# Patient Record
Sex: Male | Born: 1955 | Race: White | Hispanic: No | Marital: Married | Smoking: Never smoker
Health system: Southern US, Community
[De-identification: ages and names within clinical notes are randomized; demographics above are authoritative.]

## PROBLEM LIST (undated history)

## (undated) DIAGNOSIS — I10 Essential (primary) hypertension: Secondary | ICD-10-CM

## (undated) DIAGNOSIS — F419 Anxiety disorder, unspecified: Secondary | ICD-10-CM

## (undated) DIAGNOSIS — E7800 Pure hypercholesterolemia, unspecified: Secondary | ICD-10-CM

## (undated) HISTORY — PX: CORONARY ARTERY BYPASS GRAFT: SHX141

## (undated) HISTORY — PX: CAROTID STENT INSERTION: SHX5766

---

## 2006-03-17 DIAGNOSIS — I2510 Atherosclerotic heart disease of native coronary artery without angina pectoris: Secondary | ICD-10-CM | POA: Insufficient documentation

## 2006-03-17 DIAGNOSIS — E785 Hyperlipidemia, unspecified: Secondary | ICD-10-CM | POA: Insufficient documentation

## 2006-03-17 DIAGNOSIS — I10 Essential (primary) hypertension: Secondary | ICD-10-CM | POA: Insufficient documentation

## 2012-06-05 LAB — HEPATIC FUNCTION PANEL
ALK PHOS: 83 U/L (ref 25–125)
ALT: 42 U/L — AB (ref 10–40)
AST: 29 U/L (ref 14–40)
BILIRUBIN, TOTAL: 0.3 mg/dL

## 2012-06-05 LAB — CBC AND DIFFERENTIAL
HEMATOCRIT: 40 % — AB (ref 41–53)
Hemoglobin: 14 g/dL (ref 13.5–17.5)
NEUTROS ABS: 3 /uL
PLATELETS: 258 10*3/uL (ref 150–399)
WBC: 5.2 10^3/mL

## 2012-06-05 LAB — BASIC METABOLIC PANEL
BUN: 11 mg/dL (ref 4–21)
Creatinine: 1 mg/dL (ref 0.6–1.3)
GLUCOSE: 101 mg/dL
Potassium: 5 mmol/L (ref 3.4–5.3)
SODIUM: 142 mmol/L (ref 137–147)

## 2012-06-05 LAB — PSA: PSA: 1.4

## 2012-06-05 LAB — HEMOGLOBIN A1C: Hemoglobin A1C: 5.9

## 2013-06-05 ENCOUNTER — Emergency Department: Payer: Self-pay | Admitting: Emergency Medicine

## 2013-06-05 LAB — PROTIME-INR
INR: 1
Prothrombin Time: 12.6 secs (ref 11.5–14.7)

## 2013-06-05 LAB — CBC
HCT: 41.2 % (ref 40.0–52.0)
HGB: 13.7 g/dL (ref 13.0–18.0)
MCH: 30.9 pg (ref 26.0–34.0)
MCHC: 33.2 g/dL (ref 32.0–36.0)
MCV: 93 fL (ref 80–100)
Platelet: 270 10*3/uL (ref 150–440)
RBC: 4.43 10*6/uL (ref 4.40–5.90)
RDW: 13.3 % (ref 11.5–14.5)
WBC: 8.8 10*3/uL (ref 3.8–10.6)

## 2013-06-05 LAB — BASIC METABOLIC PANEL
ANION GAP: 9 (ref 7–16)
BUN: 22 mg/dL — ABNORMAL HIGH (ref 7–18)
CREATININE: 1.05 mg/dL (ref 0.60–1.30)
Calcium, Total: 9 mg/dL (ref 8.5–10.1)
Chloride: 103 mmol/L (ref 98–107)
Co2: 27 mmol/L (ref 21–32)
EGFR (Non-African Amer.): >60
Glucose: 145 mg/dL — ABNORMAL HIGH (ref 65–99)
Osmolality: 283 (ref 275–301)
POTASSIUM: 4.2 mmol/L (ref 3.5–5.1)
Sodium: 139 mmol/L (ref 136–145)

## 2013-06-05 LAB — TROPONIN I: Troponin-I: <0.02 ng/mL

## 2013-06-05 LAB — PRO B NATRIURETIC PEPTIDE: B-Type Natriuretic Peptide: 73 pg/mL (ref 0–125)

## 2013-06-06 DIAGNOSIS — Z955 Presence of coronary angioplasty implant and graft: Secondary | ICD-10-CM | POA: Insufficient documentation

## 2013-06-06 DIAGNOSIS — I2510 Atherosclerotic heart disease of native coronary artery without angina pectoris: Secondary | ICD-10-CM | POA: Insufficient documentation

## 2013-06-12 DIAGNOSIS — Z951 Presence of aortocoronary bypass graft: Secondary | ICD-10-CM | POA: Insufficient documentation

## 2013-06-12 DIAGNOSIS — D62 Acute posthemorrhagic anemia: Secondary | ICD-10-CM | POA: Insufficient documentation

## 2013-06-12 DIAGNOSIS — G8918 Other acute postprocedural pain: Secondary | ICD-10-CM | POA: Insufficient documentation

## 2013-10-14 ENCOUNTER — Other Ambulatory Visit: Payer: Self-pay | Admitting: Emergency Medicine

## 2015-02-26 ENCOUNTER — Encounter: Payer: Self-pay | Admitting: Family Medicine

## 2015-02-27 DIAGNOSIS — I519 Heart disease, unspecified: Secondary | ICD-10-CM | POA: Insufficient documentation

## 2015-02-27 DIAGNOSIS — R945 Abnormal results of liver function studies: Secondary | ICD-10-CM | POA: Insufficient documentation

## 2015-02-27 DIAGNOSIS — F419 Anxiety disorder, unspecified: Secondary | ICD-10-CM | POA: Insufficient documentation

## 2015-02-27 DIAGNOSIS — E669 Obesity, unspecified: Secondary | ICD-10-CM | POA: Insufficient documentation

## 2015-02-27 DIAGNOSIS — F329 Major depressive disorder, single episode, unspecified: Secondary | ICD-10-CM | POA: Insufficient documentation

## 2015-02-27 DIAGNOSIS — F32A Depression, unspecified: Secondary | ICD-10-CM | POA: Insufficient documentation

## 2015-02-27 DIAGNOSIS — R7989 Other specified abnormal findings of blood chemistry: Secondary | ICD-10-CM | POA: Insufficient documentation

## 2015-02-27 DIAGNOSIS — R739 Hyperglycemia, unspecified: Secondary | ICD-10-CM | POA: Insufficient documentation

## 2015-02-27 DIAGNOSIS — Z9861 Coronary angioplasty status: Secondary | ICD-10-CM | POA: Insufficient documentation

## 2015-03-05 ENCOUNTER — Encounter: Payer: Self-pay | Admitting: Family Medicine

## 2015-03-12 ENCOUNTER — Encounter: Payer: Self-pay | Admitting: Family Medicine

## 2015-03-12 ENCOUNTER — Other Ambulatory Visit: Payer: Self-pay | Admitting: Family Medicine

## 2015-03-12 ENCOUNTER — Ambulatory Visit (INDEPENDENT_AMBULATORY_CARE_PROVIDER_SITE_OTHER): Payer: BLUE CROSS/BLUE SHIELD | Admitting: Family Medicine

## 2015-03-12 VITALS — BP 138/82 | HR 74 | Temp 98.0°F | Resp 16 | Ht 69.25 in | Wt 191.0 lb

## 2015-03-12 DIAGNOSIS — Z1211 Encounter for screening for malignant neoplasm of colon: Secondary | ICD-10-CM | POA: Diagnosis not present

## 2015-03-12 DIAGNOSIS — Z125 Encounter for screening for malignant neoplasm of prostate: Secondary | ICD-10-CM | POA: Diagnosis not present

## 2015-03-12 DIAGNOSIS — Z951 Presence of aortocoronary bypass graft: Secondary | ICD-10-CM

## 2015-03-12 DIAGNOSIS — I2510 Atherosclerotic heart disease of native coronary artery without angina pectoris: Secondary | ICD-10-CM | POA: Diagnosis not present

## 2015-03-12 DIAGNOSIS — R739 Hyperglycemia, unspecified: Secondary | ICD-10-CM | POA: Diagnosis not present

## 2015-03-12 DIAGNOSIS — Z0000 Encounter for general adult medical examination without abnormal findings: Secondary | ICD-10-CM | POA: Diagnosis not present

## 2015-03-12 DIAGNOSIS — H9192 Unspecified hearing loss, left ear: Secondary | ICD-10-CM | POA: Diagnosis not present

## 2015-03-12 LAB — POCT URINALYSIS DIPSTICK
Bilirubin, UA: NEGATIVE
Blood, UA: NEGATIVE
Glucose, UA: NEGATIVE
KETONES UA: NEGATIVE
LEUKOCYTES UA: NEGATIVE
Nitrite, UA: NEGATIVE
PH UA: 7
PROTEIN UA: NEGATIVE
SPEC GRAV UA: 1.01
UROBILINOGEN UA: NEGATIVE

## 2015-03-12 LAB — IFOBT (OCCULT BLOOD): IMMUNOLOGICAL FECAL OCCULT BLOOD TEST: POSITIVE

## 2015-03-12 NOTE — Progress Notes (Signed)
Patient ID: Philip Mora, male   DOB: Aug 13, 1955, 60 y.o.   MRN: [id number]  Visit Date: 03/12/2015  Today's Provider: Megan Mans, MD   Chief Complaint  Patient presents with  . Annual Exam   Subjective:  Philip Mora is a 60 y.o. male who presents today for health maintenance and complete physical. He feels fairly well. He reports exercising at least 2 times a week goes to the gym and does a lot of lifting at work. He reports he is sleeping well.  Last Tdap 05/08/12 Never had colonoscopy.  Patient saw [place] last in 2014 and did not realize its been so long. He had a heart attack about 2 years after seen [place] and has been following Dr. Lady Gary for that. He has had physical exams through work because they offered it free and he got cash for it.  Review of Systems  Constitutional: Negative.   HENT: Positive for tinnitus.   Eyes: Negative.   Respiratory: Negative.   Cardiovascular: Negative.   Gastrointestinal: Negative.   Endocrine: Negative.   Genitourinary: Negative.   Musculoskeletal: Negative.   Skin: Negative.   Allergic/Immunologic: Negative.   Neurological: Negative.   Hematological: Negative.   Psychiatric/Behavioral: Negative.     Social History   Social History  . Marital Status: Married    Spouse Name: N/A  . Number of Children: N/A  . Years of Education: N/A   Occupational History  . Not on file.   Social History Main Topics  . Smoking status: Never Smoker   . Smokeless tobacco: Never Used  . Alcohol Use: No  . Drug Use: No  . Sexual Activity: Yes   Other Topics Concern  . Not on file   Social History Narrative    Patient Active Problem List   Diagnosis Date Noted  . Anxiety 02/27/2015  . Clinical depression 02/27/2015  . Abnormal LFTs 02/27/2015  . Heart disease 02/27/2015  . Blood glucose elevated 02/27/2015  . Adiposity 02/27/2015  . Percutaneous transluminal coronary angioplasty status 02/27/2015  . Acute post-operative pain  06/12/2013  . Acute blood loss anemia 06/12/2013  . H/O coronary artery bypass surgery 06/12/2013  . Arteriosclerosis of coronary artery 06/06/2013  . Presence of stent in coronary artery 06/06/2013  . Atherosclerosis of coronary artery 03/17/2006  . BP (high blood pressure) 03/17/2006  . HLD (hyperlipidemia) 03/17/2006    Past Surgical History  Procedure Laterality Date  . Carotid stent insertion    . Coronary artery bypass graft      His family history includes Congestive Heart Failure in his father; Diabetes in his mother; Heart disease in his brother; Hypertension in his mother.     Outpatient Encounter Prescriptions as of 03/12/2015  Medication Sig Note  . atorvastatin (LIPITOR) 40 MG tablet Take 40 mg by mouth at bedtime. 03/12/2015: Received from: External Pharmacy  . clopidogrel (PLAVIX) 75 MG tablet Take 75 mg by mouth daily. 03/12/2015: Received from: External Pharmacy  . metoprolol tartrate (LOPRESSOR) 25 MG tablet TAKE 1 AND 1/2 TABLETS BY MOUTH TWICE A DAY 03/12/2015: Received from: External Pharmacy  . sertraline (ZOLOFT) 50 MG tablet Take by mouth. 02/27/2015: Patient will need to be seen for more Received from: [facility]  . [DISCONTINUED] Olmesartan-Amlodipine-HCTZ (TRIBENZOR) 20-5-12.5 MG TABS Take by mouth. 02/27/2015: Needs appointment --not seen in  more than 1 year. Received from: [facility]   No facility-administered encounter medications on file as of 03/12/2015.    Patient Care Team: Gerlene Burdock  L Wendelyn Breslow., MD as PCP - General (Family Medicine)     Objective:   Vitals:  Filed Vitals:   03/12/15 1043  BP: 138/82  Pulse: 74  Temp: 98 F (36.7 C)  Resp: 16  Height: 5' 9.25" (1.759 m)  Weight: 191 lb (86.637 kg)    Physical Exam  Constitutional: He is oriented to person, place, and time. He appears well-developed and well-nourished.  HENT:  Head: Normocephalic and atraumatic.  Right Ear: External ear normal.  Left  Ear: External ear normal.  Eyes: Conjunctivae are normal. Pupils are equal, round, and reactive to light.  Chronic lazy right eye.  Neck: Normal range of motion. Neck supple.  Cardiovascular: Normal rate, regular rhythm, normal heart sounds and intact distal pulses.   No murmur heard. Pulmonary/Chest: Effort normal and breath sounds normal. No respiratory distress. He has no wheezes.  Abdominal: Soft. He exhibits no distension. There is no tenderness.  Genitourinary: Penis normal. Guaiac positive stool. No penile tenderness.  Musculoskeletal: Normal range of motion. He exhibits no edema or tenderness.  Neurological: He is alert and oriented to person, place, and time.  Skin: Skin is warm and dry. No rash noted. No erythema.  Psychiatric: He has a normal mood and affect. His behavior is normal. Judgment and thought content normal.     Depression Screen PHQ 2/9 Scores 03/12/2015  PHQ - 2 Score 0      Assessment & Plan:   1. Annual physical exam EKG stable. - CBC with Differential/Platelet - Comprehensive metabolic panel - Lipid Panel With LDL/HDL Ratio - POCT Urinalysis Dipstick - TSH - EKG 12-Lead  2. Prostate cancer screening - PSA  3. Colon cancer screening OCLyte is positive for blood and patient is due for colonoscopy also. Refer to GI to address both. - IFOBT POC (occult bld, rslt in office) - Ambulatory referral to Gastroenterology  4. Decreased hearing, left Audiometry is abnormal in the left ear. May need referral in the future but patient wants to wait at this time. ENT/audiology  referral at patient request. 5. Arteriosclerosis of coronary artery Follows Dr. Lady Gary. Patient is working on his habits to make sure to stay as healthy as possible.  6. H/O coronary artery bypass surgery==2015  7. Blood glucose elevated History of this, will check labs, pending results. - HgB A1c I have done the exam and reviewed the above chart and it is accurate to the best of my  knowledge.

## 2015-03-13 LAB — CBC WITH DIFFERENTIAL/PLATELET
Basophils Absolute: 0 10*3/uL (ref 0.0–0.2)
Basos: 0 %
EOS (ABSOLUTE): 0.2 10*3/uL (ref 0.0–0.4)
EOS: 3 %
HEMATOCRIT: 44.5 % (ref 37.5–51.0)
Hemoglobin: 14.9 g/dL (ref 12.6–17.7)
Immature Grans (Abs): 0 10*3/uL (ref 0.0–0.1)
Immature Granulocytes: 0 %
LYMPHS ABS: 1.9 10*3/uL (ref 0.7–3.1)
Lymphs: 29 %
MCH: 31 pg (ref 26.6–33.0)
MCHC: 33.5 g/dL (ref 31.5–35.7)
MCV: 93 fL (ref 79–97)
MONOS ABS: 0.5 10*3/uL (ref 0.1–0.9)
Monocytes: 8 %
Neutrophils Absolute: 3.9 10*3/uL (ref 1.4–7.0)
Neutrophils: 60 %
Platelets: 261 10*3/uL (ref 150–379)
RBC: 4.8 x10E6/uL (ref 4.14–5.80)
RDW: 12.9 % (ref 12.3–15.4)
WBC: 6.5 10*3/uL (ref 3.4–10.8)

## 2015-03-13 LAB — COMPREHENSIVE METABOLIC PANEL
A/G RATIO: 1.8 (ref 1.1–2.5)
ALK PHOS: 89 IU/L (ref 39–117)
ALT: 40 IU/L (ref 0–44)
AST: 27 IU/L (ref 0–40)
Albumin: 4.6 g/dL (ref 3.5–5.5)
BILIRUBIN TOTAL: 0.9 mg/dL (ref 0.0–1.2)
BUN / CREAT RATIO: 14 (ref 9–20)
BUN: 14 mg/dL (ref 6–24)
CHLORIDE: 100 mmol/L (ref 96–106)
CO2: 25 mmol/L (ref 18–29)
Calcium: 10 mg/dL (ref 8.7–10.2)
Creatinine, Ser: 0.99 mg/dL (ref 0.76–1.27)
GFR calc Af Amer: 97 mL/min/{1.73_m2} (ref >59)
GFR calc non Af Amer: 84 mL/min/{1.73_m2} (ref >59)
GLOBULIN, TOTAL: 2.6 g/dL (ref 1.5–4.5)
Glucose: 90 mg/dL (ref 65–99)
Potassium: 5.2 mmol/L (ref 3.5–5.2)
Sodium: 144 mmol/L (ref 134–144)
Total Protein: 7.2 g/dL (ref 6.0–8.5)

## 2015-03-13 LAB — LIPID PANEL WITH LDL/HDL RATIO
Cholesterol, Total: 125 mg/dL (ref 100–199)
HDL: 57 mg/dL (ref >39)
LDL Calculated: 54 mg/dL (ref 0–99)
LDL/HDL RATIO: 0.9 ratio (ref 0.0–3.6)
Triglycerides: 70 mg/dL (ref 0–149)
VLDL Cholesterol Cal: 14 mg/dL (ref 5–40)

## 2015-03-13 LAB — HEMOGLOBIN A1C
ESTIMATED AVERAGE GLUCOSE: 123 mg/dL
HEMOGLOBIN A1C: 5.9 % — AB (ref 4.8–5.6)

## 2015-03-13 LAB — TSH: TSH: 2.56 u[IU]/mL (ref 0.450–4.500)

## 2015-03-13 LAB — PSA: PROSTATE SPECIFIC AG, SERUM: 2.7 ng/mL (ref 0.0–4.0)

## 2015-03-17 ENCOUNTER — Telehealth: Payer: Self-pay

## 2015-03-17 NOTE — Telephone Encounter (Signed)
Pt notified-aa

## 2015-03-17 NOTE — Telephone Encounter (Signed)
Pt returned your call. His number is [phone].  Thanks [employer]

## 2015-03-17 NOTE — Telephone Encounter (Signed)
lmtcb with patient;s wife, the number we have for patient is incorrect.-aa

## 2015-03-17 NOTE — Telephone Encounter (Signed)
-----   Message from Maple Hudson., MD sent at 03/13/2015  2:11 PM EST ----- Labs ok

## 2016-04-21 IMAGING — CR DG CHEST 1V PORT
1 series · 2 of 2 positions shown · non-contrast
Comparison: None.

CLINICAL DATA: Chest pain since yesterday.  Shortness of breath.

EXAM:
PORTABLE CHEST - 1 VIEW

[Series 1: ap · 0.17mm/px · 2 of 2 slices shown]
[im 1/2]
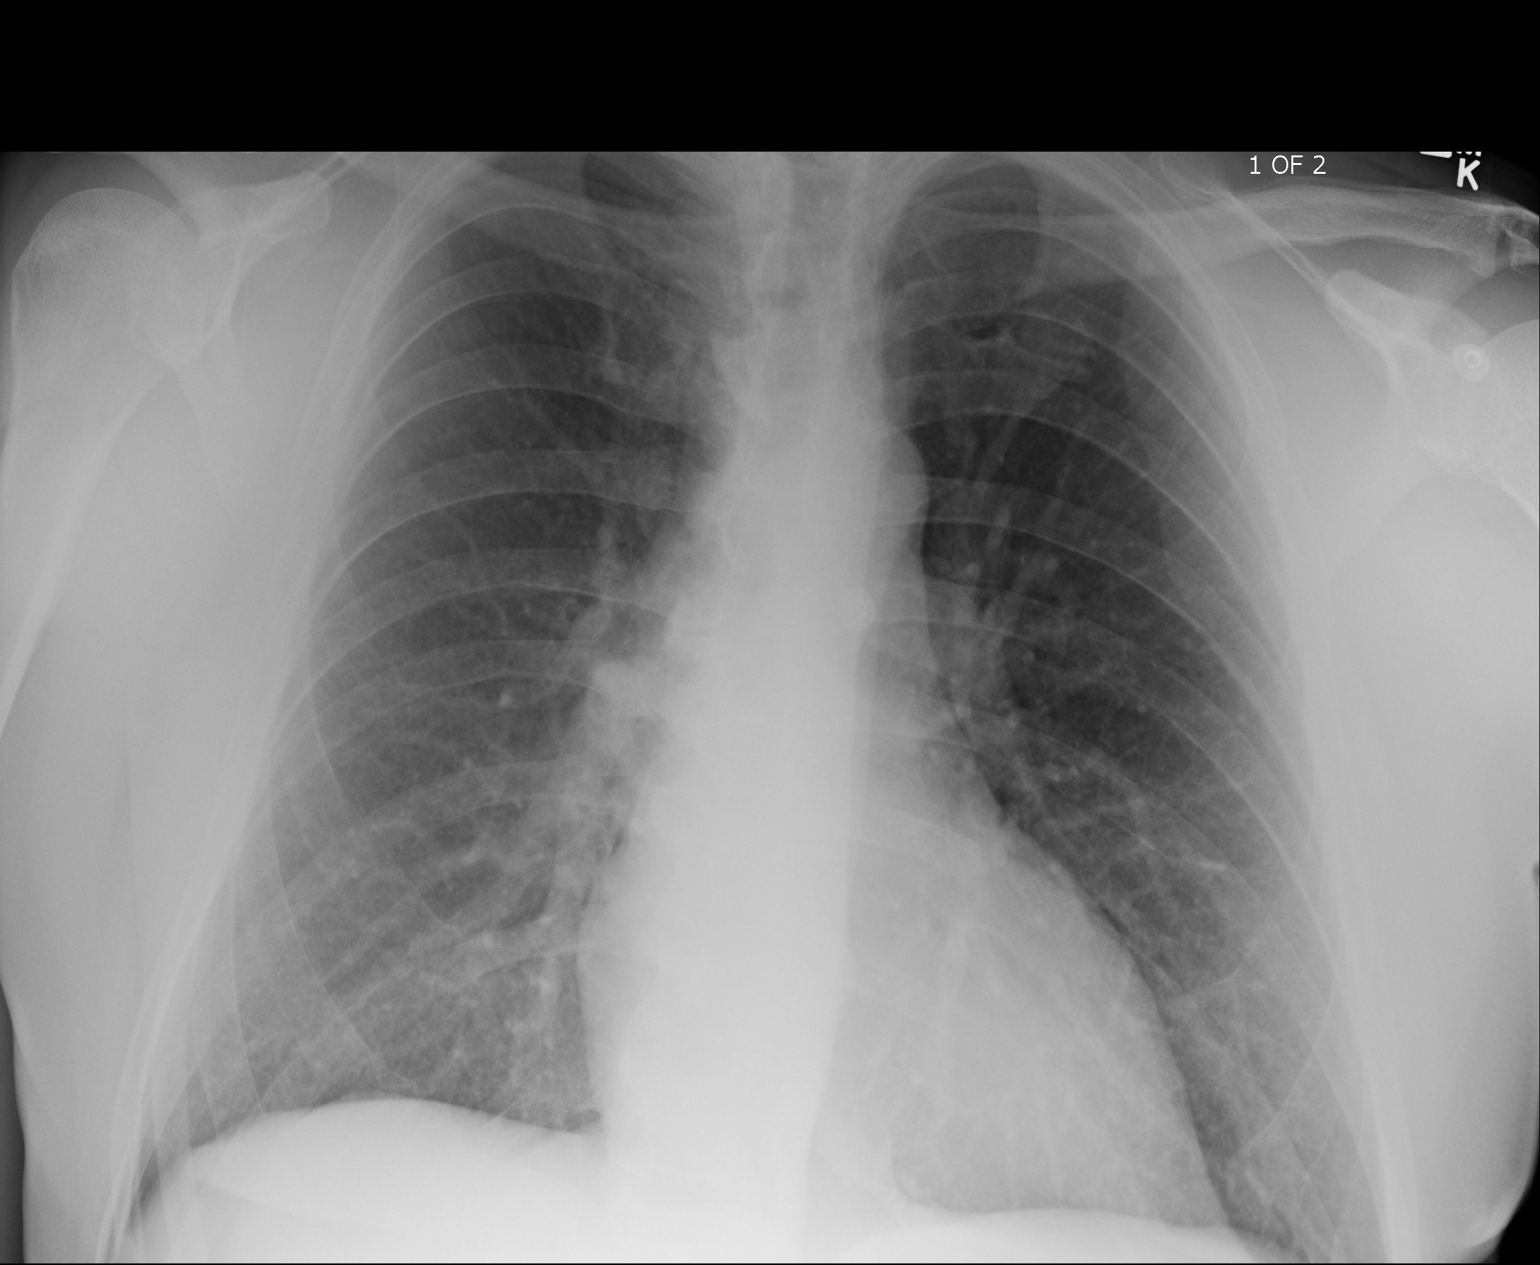
[im 2/2]
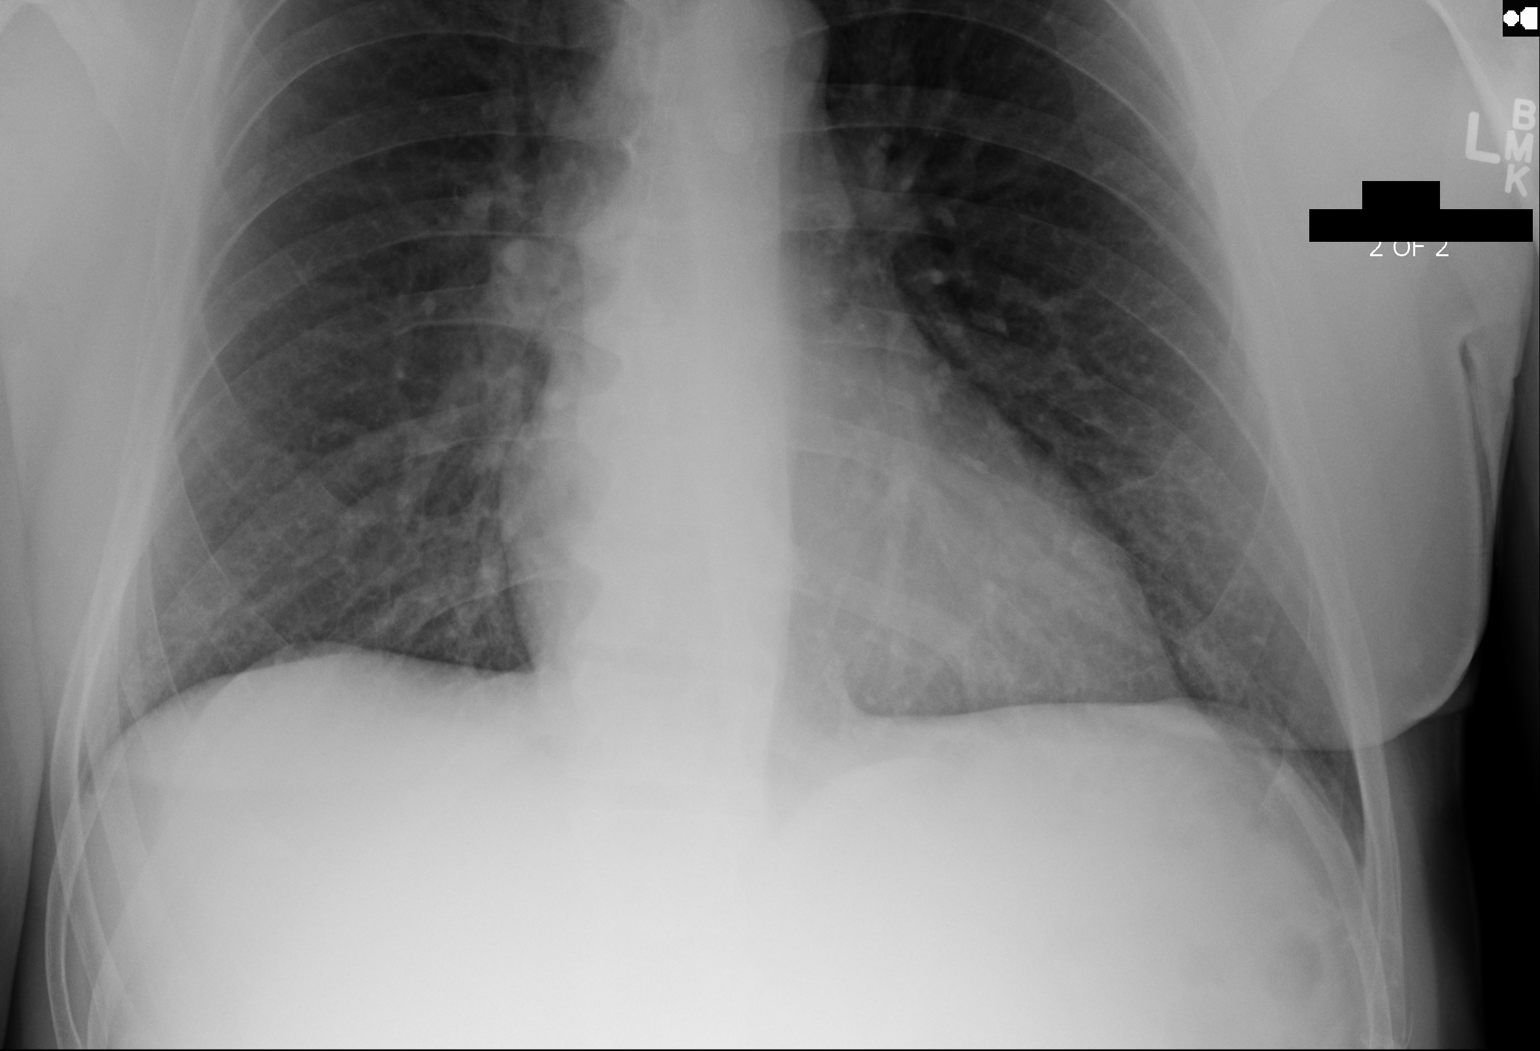

[2 of 2 positions shown; findings below may reference images not displayed]

FINDINGS: Pulmonary hyperinflation. The heart size and mediastinal contours
are within normal limits. Both lungs are clear. The visualized
skeletal structures are unremarkable.
IMPRESSION: No active disease.

## 2017-07-22 ENCOUNTER — Encounter: Payer: Self-pay | Admitting: Gynecology

## 2017-07-22 ENCOUNTER — Ambulatory Visit
Admission: EM | Admit: 2017-07-22 | Discharge: 2017-07-22 | Disposition: A | Discharge status: Home / Self Care | Payer: BLUE CROSS/BLUE SHIELD | Attending: Family Medicine | Admitting: Family Medicine

## 2017-07-22 ENCOUNTER — Other Ambulatory Visit: Payer: Self-pay

## 2017-07-22 DIAGNOSIS — J22 Unspecified acute lower respiratory infection: Secondary | ICD-10-CM

## 2017-07-22 DIAGNOSIS — R5383 Other fatigue: Secondary | ICD-10-CM | POA: Diagnosis not present

## 2017-07-22 HISTORY — DX: Essential (primary) hypertension: I10

## 2017-07-22 HISTORY — DX: Anxiety disorder, unspecified: F41.9

## 2017-07-22 HISTORY — DX: Pure hypercholesterolemia, unspecified: E78.00

## 2017-07-22 MED ORDER — PREDNISONE 20 MG PO TABS: 40.0000 mg | ORAL_TABLET | Freq: Every day | ORAL | 0 refills | Status: AC

## 2017-07-22 MED ORDER — AZITHROMYCIN 250 MG PO TABS: ORAL_TABLET | ORAL | 0 refills | Status: AC

## 2017-07-22 MED ORDER — BENZONATATE 100 MG PO CAPS: 200.0000 mg | ORAL_CAPSULE | Freq: Three times a day (TID) | ORAL | 0 refills | Status: DC | PRN

## 2017-07-22 NOTE — ED Triage Notes (Signed)
Per patient x 3 weeks with cough / sneezing. 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

## 2017-07-22 NOTE — Discharge Instructions (Signed)
Push fluids to ensure adequate hydration and keep secretions thin.  Complete course of antibiotics.  5 days of prednisone. Use of tessalon as needed for cough.  If symptoms worsen or do not improve in the next week to return to be seen or to follow up with your PCP.

## 2017-07-22 NOTE — ED Provider Notes (Signed)
[facility]    CSN: [id number] Arrival date & time: 07/22/17  1527     History   Chief Complaint Chief Complaint  Patient presents with  . Cough    HPI Philip Mora is a 62 y.o. male.   Philip Mora presents with complaints of persistent cough and fatigue as well as nasal congestion. Cough is productive of phlegm. Started three weeks ago with fevers and headache. These have improved but cough and congestion has remained. Much more fatigued this am. No shortness of breath  Or wheezing. No chest pain. No ear pain or sore throat.  No history of asthma, does not smoke. Denies gi/gu complaints. No skin rash. States works in a very cold environment so tends to have some nasal drainage but this is much more severe than usual for him. Hx of htn, anxiety, high cholesterol, cardiac stents.    ROS per HPI.      Past Medical History:  Diagnosis Date  . Anxiety   . Hypercholesteremia   . Hypertension     Patient Active Problem List   Diagnosis Date Noted  . Anxiety 02/27/2015  . Clinical depression 02/27/2015  . Abnormal LFTs 02/27/2015  . Heart disease 02/27/2015  . Blood glucose elevated 02/27/2015  . Adiposity 02/27/2015  . Percutaneous transluminal coronary angioplasty status 02/27/2015  . Acute post-operative pain 06/12/2013  . Acute blood loss anemia 06/12/2013  . H/O coronary artery bypass surgery 06/12/2013  . Arteriosclerosis of coronary artery 06/06/2013  . Presence of stent in coronary artery 06/06/2013  . Atherosclerosis of coronary artery 03/17/2006  . BP (high blood pressure) 03/17/2006  . HLD (hyperlipidemia) 03/17/2006    Past Surgical History:  Procedure Laterality Date  . CAROTID STENT INSERTION    . CORONARY ARTERY BYPASS GRAFT         Home Medications    Prior to Admission medications   Medication Sig Start Date End Date Taking? Authorizing Provider  atorvastatin (LIPITOR) 40 MG tablet Take 40 mg by mouth at bedtime. 02/18/15  Yes  [provider]  metoprolol tartrate (LOPRESSOR) 25 MG tablet TAKE 1 AND 1/2 TABLETS BY MOUTH TWICE A DAY 02/18/15  Yes [provider]  sertraline (ZOLOFT) 50 MG tablet Take by mouth. 05/16/13  Yes [provider]  azithromycin (ZITHROMAX) 250 MG tablet Take 2 tablets (500 mg total) by mouth daily for 1 day, THEN 1 tablet (250 mg total) daily for 4 days. 07/22/17 07/27/17  Georgetta Haber, NP  benzonatate (TESSALON) 100 MG capsule Take 2 capsules (200 mg total) by mouth 3 (three) times daily as needed for cough. 07/22/17   Georgetta Haber, NP  predniSONE (DELTASONE) 20 MG tablet Take 2 tablets (40 mg total) by mouth daily with breakfast for 5 days. 07/22/17 07/27/17  Georgetta Haber, NP    Family History Family History  Problem Relation Age of Onset  . Diabetes Mother   . Hypertension Mother   . Congestive Heart Failure Father   . Heart disease Brother        CABG x 2    Social History Social History   Tobacco Use  . Smoking status: Never Smoker  . Smokeless tobacco: Never Used  Substance Use Topics  . Alcohol use: No  . Drug use: No     Allergies   Patient has no known allergies.   Review of Systems Review of Systems   Physical Exam Triage Vital Signs ED Triage Vitals  Enc Vitals Group  BP 07/22/17 1553 (!) 149/99     Pulse Rate 07/22/17 1553 60     Resp 07/22/17 1553 18     Temp 07/22/17 1553 98.3 F (36.8 C)     Temp Source 07/22/17 1553 Oral     SpO2 07/22/17 1553 100 %     Weight 07/22/17 1554 185 lb (83.9 kg)     Height 07/22/17 1554 5\' 10"  (1.778 m)     Head Circumference --      Peak Flow --      Pain Score 07/22/17 1554 6     Pain Loc --      Pain Edu? --      Excl. in GC? --    No data found.  Updated Vital Signs BP (!) 149/99 (BP Location: Left Arm)   Pulse 60   Temp 98.3 F (36.8 C) (Oral)   Resp 18   Ht 5\' 10"  (1.778 m)   Wt 185 lb (83.9 kg)   SpO2 100%   BMI 26.54 kg/m   Visual Acuity Right Eye Distance:     Left Eye Distance:   Bilateral Distance:    Right Eye Near:   Left Eye Near:    Bilateral Near:     Physical Exam  Constitutional: He is oriented to person, place, and time. He appears well-developed and well-nourished.  HENT:  Head: Normocephalic and atraumatic.  Right Ear: Tympanic membrane, external ear and ear canal normal.  Left Ear: Tympanic membrane, external ear and ear canal normal.  Nose: Nose normal. Right sinus exhibits no maxillary sinus tenderness and no frontal sinus tenderness. Left sinus exhibits no maxillary sinus tenderness and no frontal sinus tenderness.  Mouth/Throat: Uvula is midline, oropharynx is clear and moist and mucous membranes are normal.  Eyes: Pupils are equal, round, and reactive to light. Conjunctivae are normal.  Neck: Normal range of motion.  Cardiovascular: Normal rate and regular rhythm.  Pulmonary/Chest: Effort normal and breath sounds normal.  Strong congested cough noted throughout exam   Lymphadenopathy:    He has no cervical adenopathy.  Neurological: He is alert and oriented to person, place, and time.  Skin: Skin is warm and dry.  Vitals reviewed.    UC Treatments / Results  Labs (all labs ordered are listed, but only abnormal results are displayed) Labs Reviewed - No data to display  EKG None  Radiology No results found.  Procedures Procedures (including critical care time)  Medications Ordered in UC Medications - No data to display  Initial Impression / Assessment and Plan / UC Course  I have reviewed the triage vital signs and the nursing notes.  Pertinent labs & imaging results that were available during my care of the patient were reviewed by me and considered in my medical decision making (see chart for details).     Non toxic in appearance. Afebrile. Without hypoxia, tachypnea or tachycardia. Three weeks of illness. Will cover with azithromycin as well as 5 days of prednisone. Tessalon as needed. Return  precautions provided. Patient verbalized understanding and agreeable to plan.    Final Clinical Impressions(s) / UC Diagnoses   Final diagnoses:  Lower respiratory tract infection     Discharge Instructions     Push fluids to ensure adequate hydration and keep secretions thin.  Complete course of antibiotics.  5 days of prednisone. Use of tessalon as needed for cough.  If symptoms worsen or do not improve in the next week to return to be seen or  to follow up with your PCP.     ED Prescriptions    Medication Sig Dispense Auth. Provider   azithromycin (ZITHROMAX) 250 MG tablet Take 2 tablets (500 mg total) by mouth daily for 1 day, THEN 1 tablet (250 mg total) daily for 4 days. 6 tablet Linus Mako B, NP   predniSONE (DELTASONE) 20 MG tablet Take 2 tablets (40 mg total) by mouth daily with breakfast for 5 days. 10 tablet Linus Mako B, NP   benzonatate (TESSALON) 100 MG capsule Take 2 capsules (200 mg total) by mouth 3 (three) times daily as needed for cough. 21 capsule Georgetta Haber, NP     Controlled Substance Prescriptions [place] Controlled Substance Registry consulted? Not Applicable   Georgetta Haber, NP 07/22/17 1621

## 2017-10-04 ENCOUNTER — Other Ambulatory Visit: Payer: Self-pay

## 2017-10-04 ENCOUNTER — Encounter: Payer: Self-pay | Admitting: Emergency Medicine

## 2017-10-04 ENCOUNTER — Ambulatory Visit (INDEPENDENT_AMBULATORY_CARE_PROVIDER_SITE_OTHER)
Admission: EM | Admit: 2017-10-04 | Discharge: 2017-10-04 | Disposition: A | Discharge status: Other Acute Inpatient Hospital | Payer: BLUE CROSS/BLUE SHIELD | Source: Home / Self Care | Attending: Family Medicine | Admitting: Family Medicine

## 2017-10-04 ENCOUNTER — Emergency Department
Admission: EM | Admit: 2017-10-04 | Discharge: 2017-10-04 | Disposition: A | Discharge status: Home / Self Care | Payer: BLUE CROSS/BLUE SHIELD | Attending: Emergency Medicine | Admitting: Emergency Medicine

## 2017-10-04 ENCOUNTER — Emergency Department: Payer: BLUE CROSS/BLUE SHIELD

## 2017-10-04 DIAGNOSIS — R0602 Shortness of breath: Secondary | ICD-10-CM | POA: Diagnosis not present

## 2017-10-04 DIAGNOSIS — Z951 Presence of aortocoronary bypass graft: Secondary | ICD-10-CM | POA: Insufficient documentation

## 2017-10-04 DIAGNOSIS — Z955 Presence of coronary angioplasty implant and graft: Secondary | ICD-10-CM | POA: Insufficient documentation

## 2017-10-04 DIAGNOSIS — I2510 Atherosclerotic heart disease of native coronary artery without angina pectoris: Secondary | ICD-10-CM | POA: Insufficient documentation

## 2017-10-04 DIAGNOSIS — R002 Palpitations: Secondary | ICD-10-CM

## 2017-10-04 DIAGNOSIS — R9431 Abnormal electrocardiogram [ECG] [EKG]: Secondary | ICD-10-CM

## 2017-10-04 DIAGNOSIS — I10 Essential (primary) hypertension: Secondary | ICD-10-CM | POA: Diagnosis not present

## 2017-10-04 LAB — BASIC METABOLIC PANEL
Anion gap: 10 (ref 5–15)
BUN: 17 mg/dL (ref 6–20)
CHLORIDE: 104 mmol/L (ref 98–111)
CO2: 26 mmol/L (ref 22–32)
Calcium: 9.2 mg/dL (ref 8.9–10.3)
Creatinine, Ser: 1.09 mg/dL (ref 0.61–1.24)
GFR calc non Af Amer: >60 mL/min (ref >60)
Glucose, Bld: 116 mg/dL — ABNORMAL HIGH (ref 70–99)
Potassium: 3.9 mmol/L (ref 3.5–5.1)
SODIUM: 140 mmol/L (ref 135–145)

## 2017-10-04 LAB — CBC
HEMATOCRIT: 38.4 % — AB (ref 40.0–52.0)
HEMOGLOBIN: 13.4 g/dL (ref 13.0–18.0)
MCH: 32.5 pg (ref 26.0–34.0)
MCHC: 34.9 g/dL (ref 32.0–36.0)
MCV: 93 fL (ref 80.0–100.0)
Platelets: 208 10*3/uL (ref 150–440)
RBC: 4.13 MIL/uL — ABNORMAL LOW (ref 4.40–5.90)
RDW: 13.2 % (ref 11.5–14.5)
WBC: 6.4 10*3/uL (ref 3.8–10.6)

## 2017-10-04 LAB — TROPONIN I
Troponin I: <0.03 ng/mL (ref <0.03)
Troponin I: <0.03 ng/mL (ref <0.03)

## 2017-10-04 MED ORDER — LORAZEPAM 1 MG PO TABS: 1.0000 mg | ORAL_TABLET | Freq: Two times a day (BID) | ORAL | 0 refills | Status: AC

## 2017-10-04 MED ORDER — DIAZEPAM 5 MG PO TABS
5.0000 mg | ORAL_TABLET | Freq: Once | ORAL | Status: AC
  Administered 2017-10-04: 5 mg via ORAL
  Filled 2017-10-04: qty 1

## 2017-10-04 MED ORDER — ASPIRIN 81 MG PO CHEW
324.0000 mg | CHEWABLE_TABLET | Freq: Once | ORAL | Status: AC
  Administered 2017-10-04: 324 mg via ORAL

## 2017-10-04 NOTE — ED Notes (Signed)
Pt provided with meal tray by Dr. Mayford Knife.

## 2017-10-04 NOTE — ED Notes (Signed)
EMS called to transport patient to ED 

## 2017-10-04 NOTE — ED Provider Notes (Signed)
[facility]    CSN: [id number] Arrival date & time: 10/04/17  0810     History   Chief Complaint Chief Complaint  Patient presents with  . Palpitations  . Shortness of Breath    HPI Philip Mora is a 62 y.o. male.   62 yo male with a h/o CAD diagnosed at age 72 (s/p PCI), s/p CABG x4 in 2015, presents with a c/o intermittent chest pains/discomfort and palpitations associated with shortness of breath since last night. States it feel light heart is skipping beats. Pain does not radiate. Denies any fevers, chills, cough. States he's been under severe stress at work from working third shift and has only had 1 hour of sleep for the past 3-4 days. States currently feeling some slight palpitations and shortness of breath.    The history is provided by the patient.  Palpitations  Associated symptoms: shortness of breath   Shortness of Breath    Past Medical History:  Diagnosis Date  . Anxiety   . Hypercholesteremia   . Hypertension     Patient Active Problem List   Diagnosis Date Noted  . Anxiety 02/27/2015  . Clinical depression 02/27/2015  . Abnormal LFTs 02/27/2015  . Heart disease 02/27/2015  . Blood glucose elevated 02/27/2015  . Adiposity 02/27/2015  . Percutaneous transluminal coronary angioplasty status 02/27/2015  . Acute post-operative pain 06/12/2013  . Acute blood loss anemia 06/12/2013  . H/O coronary artery bypass surgery 06/12/2013  . Arteriosclerosis of coronary artery 06/06/2013  . Presence of stent in coronary artery 06/06/2013  . Atherosclerosis of coronary artery 03/17/2006  . BP (high blood pressure) 03/17/2006  . HLD (hyperlipidemia) 03/17/2006    Past Surgical History:  Procedure Laterality Date  . CAROTID STENT INSERTION    . CORONARY ARTERY BYPASS GRAFT         Home Medications    Prior to Admission medications   Medication Sig Start Date End Date Taking? Authorizing Provider  atorvastatin (LIPITOR) 40 MG tablet Take 40  mg by mouth at bedtime. 02/18/15  Yes [provider]  metoprolol tartrate (LOPRESSOR) 25 MG tablet TAKE 1 AND 1/2 TABLETS BY MOUTH TWICE A DAY 02/18/15  Yes [provider]  sertraline (ZOLOFT) 50 MG tablet Take by mouth. 05/16/13  Yes [provider]  benzonatate (TESSALON) 100 MG capsule Take 2 capsules (200 mg total) by mouth 3 (three) times daily as needed for cough. 07/22/17   Georgetta Haber, NP    Family History Family History  Problem Relation Age of Onset  . Diabetes Mother   . Hypertension Mother   . Congestive Heart Failure Father   . Heart disease Brother        CABG x 2    Social History Social History   Tobacco Use  . Smoking status: Never Smoker  . Smokeless tobacco: Never Used  Substance Use Topics  . Alcohol use: No  . Drug use: No     Allergies   Patient has no known allergies.   Review of Systems Review of Systems  Respiratory: Positive for shortness of breath.   Cardiovascular: Positive for palpitations.     Physical Exam Triage Vital Signs ED Triage Vitals  Enc Vitals Group     BP 10/04/17 0826 (!) 166/87     Pulse Rate 10/04/17 0826 62     Resp 10/04/17 0826 18     Temp 10/04/17 0826 98.4 F (36.9 C)     Temp Source 10/04/17  4540 Oral     SpO2 10/04/17 0826 99 %     Weight 10/04/17 0827 190 lb (86.2 kg)     Height 10/04/17 0827 5\' 10"  (1.778 m)     Head Circumference --      Peak Flow --      Pain Score 10/04/17 0827 7     Pain Loc --      Pain Edu? --      Excl. in GC? --    No data found.  Updated Vital Signs BP (!) 166/87 (BP Location: Left Arm)   Pulse 62   Temp 98.4 F (36.9 C) (Oral)   Resp 18   Ht 5\' 10"  (1.778 m)   Wt 86.2 kg   SpO2 99%   BMI 27.26 kg/m   Visual Acuity Right Eye Distance:   Left Eye Distance:   Bilateral Distance:    Right Eye Near:   Left Eye Near:    Bilateral Near:     Physical Exam  Constitutional: He appears well-developed and well-nourished. No distress.    HENT:  Head: Normocephalic and atraumatic.  Right Ear: Tympanic membrane, external ear and ear canal normal.  Left Ear: Tympanic membrane, external ear and ear canal normal.  Nose: Nose normal.  Mouth/Throat: Uvula is midline, oropharynx is clear and moist and mucous membranes are normal. No oropharyngeal exudate or tonsillar abscesses.  Eyes: Pupils are equal, round, and reactive to light. Conjunctivae and EOM are normal. Right eye exhibits no discharge. Left eye exhibits no discharge. No scleral icterus.  Neck: Normal range of motion. Neck supple. No tracheal deviation present. No thyromegaly present.  Cardiovascular: Normal rate, regular rhythm and normal heart sounds.  Pulmonary/Chest: Effort normal and breath sounds normal. No stridor. No respiratory distress. He has no wheezes. He has no rales. He exhibits no tenderness.  Lymphadenopathy:    He has no cervical adenopathy.  Neurological: He is alert.  Skin: Skin is warm and dry. No rash noted. He is not diaphoretic.  Nursing note and vitals reviewed.    UC Treatments / Results  Labs (all labs ordered are listed, but only abnormal results are displayed) Labs Reviewed - No data to display  EKG None  Radiology No results found.  Procedures ED EKG Date/Time: 10/04/2017 9:09 AM Performed by: Payton Mccallum, MD Authorized by: Payton Mccallum, MD   ECG reviewed by ED Physician in the absence of a cardiologist: yes   Previous ECG:    Previous ECG:  Compared to current   Similarity:  Changes noted Interpretation:    Interpretation: abnormal   Rate:    ECG rate:  60   ECG rate assessment: normal   Ectopy:    Ectopy: aberrant and PAC   QRS:    QRS axis:  Normal Conduction:    Conduction: abnormal   T waves:    T waves: inverted     Inverted:  V1, V2, V3, V4, V5 and V6 Q waves:    Q waves:  V1, V2 and V3   (including critical care time)  Medications Ordered in UC Medications  aspirin chewable tablet 324 mg (324 mg  Oral Given 10/04/17 0907)    Initial Impression / Assessment and Plan / UC Course  I have reviewed the triage vital signs and the nursing notes.  Pertinent labs & imaging results that were available during my care of the patient were reviewed by me and considered in my medical decision making (see chart for details).  Final Clinical Impressions(s) / UC Diagnoses   Final diagnoses:  Palpitations  Shortness of breath  Nonspecific abnormal electrocardiogram (ECG) (EKG)  CAD, multiple vessel   Discharge Instructions   None    ED Prescriptions    None      1. ekg results and possible diagnoses reviewed with patient and wife; due to patient's extensive cardiac history (s/p PCI, s/p CABG) and new onset symptoms as well as new ekg changes, recommend patient go to Emergency Department for further evaluation and management. Patient given 4 baby ASA, 1/2 in NTG paste, IV lock. Patient in stable condition. Report called to [facility] ED charge RN.    Controlled Substance Prescriptions [place] Controlled Substance Registry consulted? Not Applicable   Payton Mccallum, MD 10/04/17 [phone]

## 2017-10-04 NOTE — ED Triage Notes (Signed)
Patient c/o intermittent chest pain, shortness of breath, heart palpitations and unable to sleep x 2 weeks. 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 He states he is averaging about 1 hour of sleep a day.

## 2017-10-04 NOTE — ED Triage Notes (Signed)
Pt brought from [facility] urgent care via EMS for EKG changes. Pt reports chest pain onset 2330 last night with SOB, palpitations, and diaphoresis. Pt went to urgent care this morning when he wasn't feeling any better. Currently pt denies CP and reports only weakness. 324 ASA given. 20G R hand by [facility].

## 2017-10-04 NOTE — ED Provider Notes (Signed)
[facility] Emergency Department Provider Note       Time seen: ----------------------------------------- 9:56 AM on 10/04/2017 -----------------------------------------   I have reviewed the triage vital signs and the nursing notes.  HISTORY   Chief Complaint Chest Pain    HPI Philip Mora is a 62 y.o. male with a history of anxiety, hyperlipidemia, hypertension and MI with bypass who presents to the ED for palpitations.  Patient has not had any chest pain as noted in the triage note but was sent here from the urgent care for EKG changes.  He describes feelings like his heart is pounding but again has not had chest pain.  He is not sure if he is really had any shortness of breath.  There is been no changes in his medications.  He was given full dose aspirin prior to arrival.  Past Medical History:  Diagnosis Date  . Anxiety   . Hypercholesteremia   . Hypertension     Patient Active Problem List   Diagnosis Date Noted  . Anxiety 02/27/2015  . Clinical depression 02/27/2015  . Abnormal LFTs 02/27/2015  . Heart disease 02/27/2015  . Blood glucose elevated 02/27/2015  . Adiposity 02/27/2015  . Percutaneous transluminal coronary angioplasty status 02/27/2015  . Acute post-operative pain 06/12/2013  . Acute blood loss anemia 06/12/2013  . H/O coronary artery bypass surgery 06/12/2013  . Arteriosclerosis of coronary artery 06/06/2013  . Presence of stent in coronary artery 06/06/2013  . Atherosclerosis of coronary artery 03/17/2006  . BP (high blood pressure) 03/17/2006  . HLD (hyperlipidemia) 03/17/2006    Past Surgical History:  Procedure Laterality Date  . CAROTID STENT INSERTION    . CORONARY ARTERY BYPASS GRAFT      Allergies Patient has no known allergies.  Social History Social History   Tobacco Use  . Smoking status: Never Smoker  . Smokeless tobacco: Never Used  Substance Use Topics  . Alcohol use: No  . Drug use: No    Review of Systems Constitutional: Negative for fever. Cardiovascular: Negative for chest pain, positive for palpitations Respiratory: Negative for shortness of breath. Gastrointestinal: Negative for abdominal pain, vomiting and diarrhea. Musculoskeletal: Negative for back pain. Skin: Negative for rash. Neurological: Negative for headaches, focal weakness or numbness.  All systems negative/normal/unremarkable except as stated in the HPI  ____________________________________________   PHYSICAL EXAM:  VITAL SIGNS: ED Triage Vitals  Enc Vitals Group     BP 10/04/17 0954 (!) 156/92     Pulse Rate 10/04/17 0952 62     Resp 10/04/17 0952 16     Temp 10/04/17 0952 98.2 F (36.8 C)     Temp Source 10/04/17 0952 Oral     SpO2 10/04/17 0952 99 %     Weight 10/04/17 0956 190 lb (86.2 kg)     Height 10/04/17 0956 5\' 10"  (1.778 m)     Head Circumference --      Peak Flow --      Pain Score 10/04/17 0952 0     Pain Loc --      Pain Edu? --      Excl. in GC? --    Constitutional: Alert and oriented. Well appearing and in no distress. Eyes: Conjunctivae are normal. Normal extraocular movements. ENT   Head: Normocephalic and atraumatic.   Nose: No congestion/rhinnorhea.   Mouth/Throat: Mucous membranes are moist.   Neck: No stridor. Cardiovascular: Normal rate, regular rhythm. No murmurs, rubs, or gallops. Respiratory: Normal respiratory effort without tachypnea  nor retractions. Breath sounds are clear and equal bilaterally. No wheezes/rales/rhonchi. Gastrointestinal: Soft and nontender. Normal bowel sounds Musculoskeletal: Nontender with normal range of motion in extremities. No lower extremity tenderness nor edema. Neurologic:  Normal speech and language. No gross focal neurologic deficits are appreciated.  Skin:  Skin is warm, dry and intact. No rash noted. Psychiatric: Mood and affect are normal. Speech and behavior are normal.   ____________________________________________  EKG: Interpreted by me.  Sinus rhythm the rate of 61 bpm, normal PR interval, LVH with repolarization abnormality, normal QT  ____________________________________________  ED COURSE:  As part of my medical decision making, I reviewed the following data within the electronic MEDICAL RECORD NUMBER History obtained from family if available, nursing notes, old chart and ekg, as well as notes from prior ED visits. Patient presented for palpitations, we will assess with labs and imaging as indicated at this time.   Procedures ____________________________________________   LABS (pertinent positives/negatives)  Labs Reviewed  BASIC METABOLIC PANEL - Abnormal; Notable for the following components:      Result Value   Glucose, Bld 116 (*)    All other components within normal limits  CBC - Abnormal; Notable for the following components:   RBC 4.13 (*)    HCT 38.4 (*)    All other components within normal limits  TROPONIN I  TROPONIN I    RADIOLOGY Images were viewed by me  Chest x-ray Does not reveal any acute process ____________________________________________  DIFFERENTIAL DIAGNOSIS   Arrhythmia, MI, unstable angina, medication side effect  FINAL ASSESSMENT AND PLAN  Palpitations   Plan: The patient had presented for palpitations. Patient's labs was negative including repeat troponin. Patient's imaging is unremarkable.  His symptoms are likely anxiety related and we will try as needed Ativan.  He will be encouraged to follow-up with cardiology for recheck.   61ice Dash, MD   Note: This note was generated in part or whole with voice recognition software. Voice recognition is usually quite accurate but there are transcription errors that can and very often do occur. I apologize for any typographical errors that were not detected and corrected.     Emily Filbert, MD 10/04/17 1229

## 2017-10-05 ENCOUNTER — Ambulatory Visit (INDEPENDENT_AMBULATORY_CARE_PROVIDER_SITE_OTHER): Payer: BLUE CROSS/BLUE SHIELD | Admitting: Family Medicine

## 2017-10-05 ENCOUNTER — Encounter: Payer: Self-pay | Admitting: Family Medicine

## 2017-10-05 VITALS — BP 140/78 | HR 52 | Resp 16 | Wt 193.0 lb

## 2017-10-05 DIAGNOSIS — G4709 Other insomnia: Secondary | ICD-10-CM

## 2017-10-05 DIAGNOSIS — F419 Anxiety disorder, unspecified: Secondary | ICD-10-CM | POA: Diagnosis not present

## 2017-10-05 DIAGNOSIS — E782 Mixed hyperlipidemia: Secondary | ICD-10-CM | POA: Diagnosis not present

## 2017-10-05 DIAGNOSIS — I10 Essential (primary) hypertension: Secondary | ICD-10-CM

## 2017-10-05 DIAGNOSIS — F329 Major depressive disorder, single episode, unspecified: Secondary | ICD-10-CM

## 2017-10-05 DIAGNOSIS — I25810 Atherosclerosis of coronary artery bypass graft(s) without angina pectoris: Secondary | ICD-10-CM | POA: Diagnosis not present

## 2017-10-05 MED ORDER — SERTRALINE HCL 100 MG PO TABS: 100.0000 mg | ORAL_TABLET | Freq: Every day | ORAL | 3 refills | Status: DC

## 2017-10-05 NOTE — Progress Notes (Signed)
Patient: Philip Mora Male    DOB: 03/15/1955   62 y.o.   MRN: [id number] Visit Date: 9/12/2019Doyle Askew  Today's Provider: Megan Mansichard Brighton Pilley Jr, MD   No chief complaint on file.  Subjective:    HPI   Patient [facility] and in ER yesterday for palpitations. Patient was given rx for lorazepam 1 mg bid. Patient was advised to see cardiology and pcp.   Patient is here to follow up from cardiology appointment. [facility]. Patient was fitted with 48 hour Holter Monitor. The pt works for [employer]. He recently switched from second to third shift and has had great difficulty sleeping since that switch.He complains bitterly of this problem.  No Known Allergies   Current Outpatient Medications:  .  atorvastatin (LIPITOR) 40 MG tablet, Take 40 mg by mouth at bedtime., Disp: , Rfl: 1 .  LORazepam (ATIVAN) 1 MG tablet, Take 1 tablet (1 mg total) by mouth 2 (two) times daily., Disp: 20 tablet, Rfl: 0 .  metoprolol tartrate (LOPRESSOR) 25 MG tablet, TAKE 1 AND 1/2 TABLETS BY MOUTH TWICE A DAY, Disp: , Rfl: 2 .  sertraline (ZOLOFT) 50 MG tablet, Take by mouth., Disp: , Rfl:  .  benzonatate (TESSALON) 100 MG capsule, Take 2 capsules (200 mg total) by mouth 3 (three) times daily as needed for cough. (Patient not taking: Reported on 10/05/2017), Disp: 21 capsule, Rfl: 0  Review of Systems  Constitutional: Negative.   Eyes: Negative.   Respiratory: Negative.   Cardiovascular: Negative.   Neurological: Negative.   Psychiatric/Behavioral: Positive for sleep disturbance.    Social History   Tobacco Use  . Smoking status: Never Smoker  . Smokeless tobacco: Never Used  Substance Use Topics  . Alcohol use: No   Objective:   BP 140/78 (BP Location: Left Arm, Patient Position: Sitting, Cuff Size: Large)   Pulse (!) 52   Resp 16   Wt 193 lb (87.5 kg)   SpO2 98%   BMI 27.69 kg/m  Vitals:   10/05/17 1122  BP: 140/78  Pulse: (!) 52  Resp:  16  SpO2: 98%  Weight: 193 lb (87.5 kg)     Physical Exam  Constitutional: He is oriented to person, place, and time. He appears well-developed and well-nourished.  HENT:  Head: Normocephalic and atraumatic.  Right Ear: External ear normal.  Left Ear: External ear normal.  Nose: Nose normal.  Eyes: Conjunctivae are normal. No scleral icterus.  Chronic lazy eye.  Neck: No thyromegaly present.  Cardiovascular: Normal rate, regular rhythm, normal heart sounds and intact distal pulses.  Pulmonary/Chest: Effort normal and breath sounds normal.  Abdominal: Soft.  Musculoskeletal: He exhibits no edema.  Neurological: He is alert and oriented to person, place, and time.  Skin: Skin is warm and dry.  Psychiatric: He has a normal mood and affect. His behavior is normal. Judgment normal.        Assessment & Plan:     1. Major depressive disorder with current active episode, unspecified depression episode severity, unspecified whether recurrent 1. Major depressive disorder with current active episode, unspecified depression episode severity, unspecified whether recurrent Increase Zoloft to 100mg  daily. RTC 1 month.  2. 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  3. Anxiety   4. Mixed hyperlipidemia   5. Essential hypertension   6. Other insomnia Lorazepam prn but pt cautioned about chronic use.  Devanie Galanti Cranford Mon, MD  [facility]

## 2017-10-13 ENCOUNTER — Telehealth: Payer: Self-pay

## 2017-10-13 NOTE — Telephone Encounter (Signed)
[facility] called stating that the patient told them we would not fill out his disability papers and that we said he needed to get them to do it.  [facility] said they are not keeping him out of work for anything cardiac and would not be able to fill them out.  I wasn't familiar with this patient and Heather at [facility] said she thought he was out of work b/o MDD and we were treating that.    Do we need to get the papers from him and fill them out or is he to be referred if it is keeping him from working.  Please let me know what you would like.  Thanks [employer]

## 2017-10-23 ENCOUNTER — Telehealth: Payer: Self-pay | Admitting: Family Medicine

## 2017-10-23 NOTE — Telephone Encounter (Signed)
I filled out form late last week.

## 2017-10-23 NOTE — Telephone Encounter (Signed)
[employer] states they are missing the attending physician statement. Needing this for pt's disability claim to be completed. Their fax [phone]  Claim number # [id number]  Please advise or call pt to get this straightened out.  Thanks, [employer]

## 2017-10-23 NOTE — Telephone Encounter (Signed)
Paperwork was found in Medical records.  Stamped with the date of 10/16/17 that it was faxed.  They are going to fax it again.

## 2017-10-23 NOTE — Telephone Encounter (Signed)
Do you have this?

## 2017-10-24 ENCOUNTER — Telehealth: Payer: Self-pay | Admitting: Family Medicine

## 2017-10-24 NOTE — Telephone Encounter (Signed)
Forms on Dr. Elisabeth Cara desk.

## 2017-10-24 NOTE — Telephone Encounter (Signed)
Disability form coming back that needs changes and faxed to [phone] with claim # [id number] on facesheet when faxed

## 2017-10-30 ENCOUNTER — Telehealth: Payer: Self-pay | Admitting: Family Medicine

## 2017-10-30 NOTE — Telephone Encounter (Signed)
3rd party for pt's work will be faxing a Attending Physician Form on behalf of the pt's short term disability.  They are needing more info to approve short term disability.  Pt asking if this can be done asap.  Thanks, [employer]

## 2017-11-01 ENCOUNTER — Telehealth: Payer: Self-pay | Admitting: Family Medicine

## 2017-11-01 NOTE — Telephone Encounter (Signed)
Sent a fax on Tuesday needing pt's current symptoms and treatment plan for short term disability.  Wanting to know if the fax was received.   Please advise.  Thanks, [employer]

## 2017-11-01 NOTE — Telephone Encounter (Signed)
Dr. Sullivan Lone, Do you have this?

## 2017-11-02 ENCOUNTER — Ambulatory Visit (INDEPENDENT_AMBULATORY_CARE_PROVIDER_SITE_OTHER): Payer: BLUE CROSS/BLUE SHIELD | Admitting: Family Medicine

## 2017-11-02 VITALS — BP 159/84 | HR 54 | Temp 98.1°F | Resp 16 | Wt 195.0 lb

## 2017-11-02 DIAGNOSIS — F329 Major depressive disorder, single episode, unspecified: Secondary | ICD-10-CM

## 2017-11-02 DIAGNOSIS — F419 Anxiety disorder, unspecified: Secondary | ICD-10-CM | POA: Diagnosis not present

## 2017-11-02 DIAGNOSIS — R45851 Suicidal ideations: Secondary | ICD-10-CM | POA: Diagnosis not present

## 2017-11-02 MED ORDER — SERTRALINE HCL 100 MG PO TABS: 150.0000 mg | ORAL_TABLET | Freq: Every day | ORAL | 3 refills | Status: DC

## 2017-11-02 NOTE — Progress Notes (Addendum)
Philip Mora  MRN: [id number] DOB: [birth year]  Subjective:  HPI   The patient is a 62 year old male who presents for a 1 month follow up of his depression.  He was last seen on 10/05/17.  At that time he was instructed to increase his Zoloft to 100 mg daily.  The patient states that he has been on the increased dose of Sertraline and sees a small improvement.  He states he is still not where he thinks he should be.  The patient also states that after seeing Dr Lady Gary he has started his on Losartan 25 mg and Metoprolol has been increased from 50 mg to 100 mg.   He is still very anxious, nervous and very concerned about the status of his job at this time.   He will need to have paperwork done to help get him approved for short term disability. He has had depression/anxiety his entire adult life. He is not suicidal.  Patient Active Problem List   Diagnosis Date Noted  . Anxiety 02/27/2015  . Clinical depression 02/27/2015  . Abnormal LFTs 02/27/2015  . Heart disease 02/27/2015  . Blood glucose elevated 02/27/2015  . Adiposity 02/27/2015  . Percutaneous transluminal coronary angioplasty status 02/27/2015  . Acute post-operative pain 06/12/2013  . Acute blood loss anemia 06/12/2013  . H/O coronary artery bypass surgery 06/12/2013  . Arteriosclerosis of coronary artery 06/06/2013  . Presence of stent in coronary artery 06/06/2013  . Atherosclerosis of coronary artery 03/17/2006  . BP (high blood pressure) 03/17/2006  . HLD (hyperlipidemia) 03/17/2006    Past Medical History:  Diagnosis Date  . Anxiety   . Hypercholesteremia   . Hypertension     Social History   Socioeconomic History  . Marital status: Married    Spouse name: Not on file  . Number of children: Not on file  . Years of education: Not on file  . Highest education level: Not on file  Occupational History  . Not on file  Social Needs  . Financial resource strain: Not on file  . Food insecurity:    Worry: Not  on file    Inability: Not on file  . Transportation needs:    Medical: Not on file    Non-medical: Not on file  Tobacco Use  . Smoking status: Never Smoker  . Smokeless tobacco: Never Used  Substance and Sexual Activity  . Alcohol use: No  . Drug use: No  . Sexual activity: Yes  Lifestyle  . Physical activity:    Days per week: Not on file    Minutes per session: Not on file  . Stress: Not on file  Relationships  . Social connections:    Talks on phone: Not on file    Gets together: Not on file    Attends religious service: Not on file    Active member of club or organization: Not on file    Attends meetings of clubs or organizations: Not on file    Relationship status: Not on file  . Intimate partner violence:    Fear of current or ex partner: Not on file    Emotionally abused: Not on file    Physically abused: Not on file    Forced sexual activity: Not on file  Other Topics Concern  . Not on file  Social History Narrative  . Not on file    Outpatient Encounter Medications as of 11/02/2017  Medication Sig Note  . aspirin 81 MG chewable  tablet Chew by mouth daily.   Marland Kitchen atorvastatin (LIPITOR) 40 MG tablet Take 40 mg by mouth at bedtime. 03/12/2015: Received from: External Pharmacy  . metoprolol tartrate (LOPRESSOR) 50 MG tablet Take 50 mg by mouth.   . sertraline (ZOLOFT) 100 MG tablet Take 1 tablet (100 mg total) by mouth daily.   Marland Kitchen LORazepam (ATIVAN) 1 MG tablet Take 1 tablet (1 mg total) by mouth 2 (two) times daily. (Patient not taking: Reported on 11/02/2017)   . [DISCONTINUED] benzonatate (TESSALON) 100 MG capsule Take 2 capsules (200 mg total) by mouth 3 (three) times daily as needed for cough. (Patient not taking: Reported on 10/05/2017)   . [DISCONTINUED] metoprolol tartrate (LOPRESSOR) 25 MG tablet TAKE 1 AND 1/2 TABLETS BY MOUTH TWICE A DAY 03/12/2015: Received from: External Pharmacy   No facility-administered encounter medications on file as of 11/02/2017.      No Known Allergies  Review of Systems  Constitutional: Positive for malaise/fatigue. Negative for fever.  Respiratory: Negative for cough, shortness of breath and wheezing.   Cardiovascular: Positive for chest pain and palpitations. Negative for orthopnea, claudication and leg swelling.  Psychiatric/Behavioral: Positive for depression and suicidal ideas (plan is with a gun (patient does not have access to a gun, his wife locked it to where he can not get it.)). Negative for hallucinations, memory loss and substance abuse. The patient is nervous/anxious and has insomnia (improved).     Objective:  BP (!) 159/84 (BP Location: Right Arm, Patient Position: Sitting, Cuff Size: Normal)   Pulse (!) 54   Temp 98.1 F (36.7 C) (Oral)   Resp 16   Wt 195 lb (88.5 kg)   BMI 27.98 kg/m   Physical Exam  Constitutional: He is oriented to person, place, and time and well-developed, well-nourished, and in no distress.  HENT:  Head: Normocephalic and atraumatic.  Eyes: No scleral icterus.  Neck: No thyromegaly present.  Cardiovascular: Normal rate, regular rhythm and normal heart sounds.  Pulmonary/Chest: Effort normal.  Neurological: He is alert and oriented to person, place, and time. Gait normal. GCS score is 15.  Skin: Skin is warm and dry.  Psychiatric: Mood, memory, affect and judgment normal.    Assessment and Plan :  1. Major depressive disorder with current active episode, unspecified depression episode severity, unspecified whether recurrent Improved with meds. Increase Zoloft 100 to 150mg  daily. - Ambulatory referral to Psychiatry  2. Suicidal ideation He is not suicidal but is thinking morbid thoughts. No weapons available. - Ambulatory referral to Psychiatry 3.Anxiety Pt requests disability papaers to be filled out.  I have done the exam and reviewed the chart and it is accurate to the best of my knowledge. Dentist has been used and  any errors in dictation or  transcription are unintentional. Julieanne Manson M.D. [facility]

## 2017-11-06 NOTE — Telephone Encounter (Signed)
Pt called back to find out if the form for disability has been faxed back to his company yet.  Pt;s CB# [phone]  Thanks Barth Kirks

## 2017-11-06 NOTE — Telephone Encounter (Signed)
Advised 

## 2017-11-07 NOTE — Telephone Encounter (Signed)
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 Schwieger disability.  She needs to verified disability dates and disability issues and medications.  It is on the fax that was sent but she needs more specific information.  CB#  [phone]  Thanks Barth Kirks

## 2017-11-08 ENCOUNTER — Telehealth: Payer: Self-pay | Admitting: Family Medicine

## 2017-11-08 NOTE — Telephone Encounter (Signed)
LMTCB ED 

## 2017-11-08 NOTE — Telephone Encounter (Signed)
Spoke with Jennifer

## 2017-11-08 NOTE — Telephone Encounter (Signed)
Orlie Dakin is returning Elena's call regarding pt. Please give her a call back at [phone].  Thanks, [employer]

## 2017-11-30 ENCOUNTER — Encounter: Payer: Self-pay | Admitting: Family Medicine

## 2017-11-30 ENCOUNTER — Ambulatory Visit (INDEPENDENT_AMBULATORY_CARE_PROVIDER_SITE_OTHER): Payer: BLUE CROSS/BLUE SHIELD | Admitting: Family Medicine

## 2017-11-30 VITALS — BP 132/70 | HR 52 | Temp 98.6°F | Resp 16 | Wt 195.0 lb

## 2017-11-30 DIAGNOSIS — F329 Major depressive disorder, single episode, unspecified: Secondary | ICD-10-CM | POA: Diagnosis not present

## 2017-11-30 DIAGNOSIS — F419 Anxiety disorder, unspecified: Secondary | ICD-10-CM

## 2017-11-30 MED ORDER — BUSPIRONE HCL 15 MG PO TABS: 15.0000 mg | ORAL_TABLET | Freq: Two times a day (BID) | ORAL | 5 refills | Status: DC

## 2017-11-30 MED ORDER — SERTRALINE HCL 100 MG PO TABS: 200.0000 mg | ORAL_TABLET | Freq: Every day | ORAL | 5 refills | Status: AC

## 2017-11-30 NOTE — Progress Notes (Signed)
Patient: Philip Mora Male    DOB: 16-Aug-1955   62 y.o.   MRN: [id number] Visit Date: 11/30/2017  Today's Provider: Megan Mans, MD   Chief Complaint  Patient presents with  . Depression   Subjective:    HPI Patient comes in today for a follow up. He was last seen in the office on 11/02/17 (about 1 month ago). He was advised to increase Zoloft to 150mg  daily. He was also referred to psychiatry. Patient reports that he was not able to go to the referral due to financial issues. He reports that he had been taking Zoloft 200mg  to see if it would help his symptoms. He reports that it helped his depression, but not his anxiety.  Patient states he is feeling at least 50% better.  He is going back to second shift and is pleased about this.  He wants to try BuSpar which is helped his wife and  and son and I am happy to do this.    No Known Allergies   Current Outpatient Medications:  .  aspirin 81 MG chewable tablet, Chew by mouth daily., Disp: , Rfl:  .  atorvastatin (LIPITOR) 40 MG tablet, Take 40 mg by mouth at bedtime., Disp: , Rfl: 1 .  metoprolol tartrate (LOPRESSOR) 50 MG tablet, Take 50 mg by mouth., Disp: , Rfl:  .  sertraline (ZOLOFT) 100 MG tablet, Take 1.5 tablets (150 mg total) by mouth daily., Disp: 135 tablet, Rfl: 3 .  LORazepam (ATIVAN) 1 MG tablet, Take 1 tablet (1 mg total) by mouth 2 (two) times daily. (Patient not taking: Reported on 11/02/2017), Disp: 20 tablet, Rfl: 0  Review of Systems  Constitutional: Negative.   Eyes: Negative.   Respiratory: Negative.   Cardiovascular: Negative.   Gastrointestinal: Negative.   Genitourinary: Negative.   Musculoskeletal: Negative.   Neurological: Negative.   Psychiatric/Behavioral: Negative for agitation, self-injury, sleep disturbance and suicidal ideas. The patient is nervous/anxious. The patient is not hyperactive.     Social History   Tobacco Use  . Smoking status: Never Smoker  . Smokeless tobacco:  Never Used  Substance Use Topics  . Alcohol use: No   Objective:   BP 132/70 (BP Location: Right Arm, Patient Position: Sitting, Cuff Size: Normal)   Pulse (!) 52   Temp 98.6 F (37 C)   Resp 16   Wt 195 lb (88.5 kg)   SpO2 100%   BMI 27.98 kg/m  Vitals:   11/30/17 1112  BP: 132/70  Pulse: (!) 52  Resp: 16  Temp: 98.6 F (37 C)  SpO2: 100%  Weight: 195 lb (88.5 kg)     Physical Exam  Constitutional: He is oriented to person, place, and time. He appears well-developed and well-nourished.  HENT:  Head: Normocephalic and atraumatic.  Eyes: Conjunctivae are normal. No scleral icterus.  Neck: No thyromegaly present.  Cardiovascular: Normal rate, regular rhythm and normal heart sounds.  Pulmonary/Chest: Effort normal and breath sounds normal.  Musculoskeletal: He exhibits no edema.  Neurological: He is alert and oriented to person, place, and time.  Skin: Skin is warm and dry.  Psychiatric: He has a normal mood and affect. His behavior is normal. Judgment and thought content normal.        Assessment & Plan:     1. Major depressive disorder with current active episode, unspecified depression episode severity, unspecified whether recurrent Increase sertraline to 200 mg daily.  I absolutely think the patient needs  counseling and probably psychiatric care in the future.  This is been a recurrent problem for him. - sertraline (ZOLOFT) 100 MG tablet; Take 2 tablets (200 mg total) by mouth daily.  Dispense: 60 tablet; Refill: 5  2. Anxiety Patient cleared to go back to work next week. - busPIRone (BUSPAR) 15 MG tablet; Take 1 tablet (15 mg total) by mouth 2 (two) times daily.  Dispense: 60 tablet; Refill: 5       Richard Wendelyn Breslow, MD  [facility]

## 2017-12-07 ENCOUNTER — Ambulatory Visit: Payer: Self-pay | Admitting: Family Medicine

## 2018-02-01 ENCOUNTER — Ambulatory Visit: Payer: Self-pay | Admitting: Family Medicine

## 2018-03-01 ENCOUNTER — Ambulatory Visit: Payer: Self-pay | Admitting: Family Medicine

## 2018-07-17 ENCOUNTER — Other Ambulatory Visit: Payer: Self-pay | Admitting: Family Medicine

## 2018-07-17 DIAGNOSIS — F419 Anxiety disorder, unspecified: Secondary | ICD-10-CM

## 2018-10-04 ENCOUNTER — Other Ambulatory Visit: Payer: Self-pay | Admitting: Family Medicine

## 2018-10-04 DIAGNOSIS — F419 Anxiety disorder, unspecified: Secondary | ICD-10-CM

## 2018-10-04 MED ORDER — BUSPIRONE HCL 15 MG PO TABS: 15.0000 mg | ORAL_TABLET | Freq: Two times a day (BID) | ORAL | 0 refills | Status: DC

## 2018-10-04 NOTE — Telephone Encounter (Signed)
Pt needing a refill on:  busPIRone (BUSPAR) 15 MG tablet   Please fill at:  [place] [address] [place] [facility] [place] [phone] (Phone) [phone] (Fax)   Thanks, [employer]

## 2018-10-27 ENCOUNTER — Other Ambulatory Visit: Payer: Self-pay | Admitting: Family Medicine

## 2018-10-27 DIAGNOSIS — F419 Anxiety disorder, unspecified: Secondary | ICD-10-CM

## 2018-11-23 ENCOUNTER — Other Ambulatory Visit: Payer: Self-pay | Admitting: Family Medicine

## 2018-11-23 DIAGNOSIS — F419 Anxiety disorder, unspecified: Secondary | ICD-10-CM

## 2019-02-21 ENCOUNTER — Other Ambulatory Visit: Payer: Self-pay | Admitting: Family Medicine

## 2019-02-21 DIAGNOSIS — F419 Anxiety disorder, unspecified: Secondary | ICD-10-CM

## 2019-02-21 NOTE — Telephone Encounter (Signed)
Patient's last office visit was 11/30/17. Please advise?

## 2019-02-21 NOTE — Telephone Encounter (Signed)
Requested medication (s) are due for refill today: yes  Requested medication (s) are on the active medication list: yes  Last refill:  11/23/2018  Future visit scheduled: no  Notes to clinic:  no valid encounter within last 6 months   Requested Prescriptions  Pending Prescriptions Disp Refills   busPIRone (BUSPAR) 15 MG tablet [Pharmacy Med Name: BUSPIRONE HCL 15 MG TABLET] 180 tablet 0    Sig: TAKE 1 TABLET (15 MG TOTAL) BY MOUTH 2 (TWO) TIMES DAILY.      Psychiatry: Anxiolytics/Hypnotics - Non-controlled Failed - 02/21/2019  2:30 PM      Failed - Valid encounter within last 6 months    Recent Outpatient Visits           1 year ago Major depressive disorder with current active episode, unspecified depression episode severity, unspecified whether recurrent   [facility] Maple Hudson., MD   1 year ago Major depressive disorder with current active episode, unspecified depression episode severity, unspecified whether recurrent   [facility] Maple Hudson., MD   1 year ago Major depressive disorder with current active episode, unspecified depression episode severity, unspecified whether recurrent   [facility] Maple Hudson., MD   3 years ago Annual physical exam   [facility] Maple Hudson., MD

## 2019-03-04 ENCOUNTER — Other Ambulatory Visit: Payer: Self-pay | Admitting: Family Medicine

## 2019-03-25 ENCOUNTER — Other Ambulatory Visit: Payer: Self-pay | Admitting: Family Medicine

## 2019-03-25 DIAGNOSIS — F419 Anxiety disorder, unspecified: Secondary | ICD-10-CM

## 2019-03-25 NOTE — Telephone Encounter (Signed)
Requested medication (s) are due for refill today: yes  Requested medication (s) are on the active medication list: yes  Last refill:  02/22/19  Future visit scheduled: no  Notes to clinic:  No valid encounter within last 6 months    Requested Prescriptions  Pending Prescriptions Disp Refills   busPIRone (BUSPAR) 15 MG tablet [Pharmacy Med Name: BUSPIRONE HCL 15 MG TABLET] 180 tablet 1    Sig: TAKE 1 TABLET (15 MG TOTAL) BY MOUTH 2 (TWO) TIMES DAILY.      Psychiatry: Anxiolytics/Hypnotics - Non-controlled Failed - 03/25/2019 10:32 AM      Failed - Valid encounter within last 6 months    Recent Outpatient Visits           1 year ago Major depressive disorder with current active episode, unspecified depression episode severity, unspecified whether recurrent   [facility] Maple Hudson., MD   1 year ago Major depressive disorder with current active episode, unspecified depression episode severity, unspecified whether recurrent   [facility] Maple Hudson., MD   1 year ago Major depressive disorder with current active episode, unspecified depression episode severity, unspecified whether recurrent   [facility] Maple Hudson., MD   4 years ago Annual physical exam   [facility] Maple Hudson., MD

## 2019-03-26 NOTE — Telephone Encounter (Signed)
Please advise refill? Patient has not been seen since 11/30/2017.

## 2019-06-10 ENCOUNTER — Other Ambulatory Visit: Payer: Self-pay | Admitting: Family Medicine

## 2019-06-13 ENCOUNTER — Other Ambulatory Visit: Payer: Self-pay | Admitting: Family Medicine

## 2019-06-14 ENCOUNTER — Other Ambulatory Visit: Payer: Self-pay | Admitting: Family Medicine

## 2019-06-14 NOTE — Telephone Encounter (Signed)
Requested medication (s) are due for refill today:   Yes  Requested medication (s) are on the active medication list:   Yes  Future visit scheduled:   No    Last ordered: 03/04/2019 #90 0 refills  Clinic note:   Last time this pt was seen was 11/30/2017.   Has not called in for an appt yet.  Returned for provider review.     Requested Prescriptions  Pending Prescriptions Disp Refills   sertraline (ZOLOFT) 100 MG tablet [Pharmacy Med Name: Sertraline HCl 100 MG Oral Tablet] 90 tablet 0    Sig: TAKE 1 & 1/2 (ONE & ONE-HALF) TABLETS BY MOUTH ONCE DAILY. NEED AN APPOINTMENT FOR FURTHER REFILLS.      Psychiatry:  Antidepressants - SSRI Failed - 06/14/2019  3:01 PM      Failed - Completed PHQ-2 or PHQ-9 in the last 360 days.      Failed - Valid encounter within last 6 months    Recent Outpatient Visits           1 year ago Major depressive disorder with current active episode, unspecified depression episode severity, unspecified whether recurrent   [facility] Maple Hudson., MD   1 year ago Major depressive disorder with current active episode, unspecified depression episode severity, unspecified whether recurrent   [facility] Maple Hudson., MD   1 year ago Major depressive disorder with current active episode, unspecified depression episode severity, unspecified whether recurrent   [facility] Maple Hudson., MD   4 years ago Annual physical exam   [facility] Maple Hudson., MD

## 2019-06-17 ENCOUNTER — Other Ambulatory Visit: Payer: Self-pay | Admitting: Family Medicine

## 2019-06-17 NOTE — Telephone Encounter (Signed)
Requested medication (s) are due for refill today: yes  Requested medication (s) are on the active medication list: yes  Last refill:  03/04/19  Future visit scheduled: no  Notes to clinic:  Overdue for appt- Called pt and LM on VM that he is overdue for appt. Call back number provided   Requested Prescriptions  Pending Prescriptions Disp Refills   sertraline (ZOLOFT) 100 MG tablet [Pharmacy Med Name: Sertraline HCl 100 MG Oral Tablet] 90 tablet 0    Sig: TAKE 1 & 1/2 (ONE & ONE-HALF) TABLETS BY MOUTH ONCE DAILY. NEED AN APPOINTMENT FOR FURTHER REFILLS.      Psychiatry:  Antidepressants - SSRI Failed - 06/17/2019  9:01 AM      Failed - Completed PHQ-2 or PHQ-9 in the last 360 days.      Failed - Valid encounter within last 6 months    Recent Outpatient Visits           1 year ago Major depressive disorder with current active episode, unspecified depression episode severity, unspecified whether recurrent   [facility] Maple Hudson., MD   1 year ago Major depressive disorder with current active episode, unspecified depression episode severity, unspecified whether recurrent   [facility] Maple Hudson., MD   1 year ago Major depressive disorder with current active episode, unspecified depression episode severity, unspecified whether recurrent   [facility] Maple Hudson., MD   4 years ago Annual physical exam   [facility] Maple Hudson., MD

## 2019-06-17 NOTE — Telephone Encounter (Signed)
Please advise refill? Patient has not been seen since 11/30/2017.

## 2020-01-21 ENCOUNTER — Ambulatory Visit (INDEPENDENT_AMBULATORY_CARE_PROVIDER_SITE_OTHER): Payer: BC Managed Care – PPO

## 2020-01-21 ENCOUNTER — Ambulatory Visit
Admission: EM | Admit: 2020-01-21 | Discharge: 2020-01-21 | Disposition: A | Discharge status: Home / Self Care | Payer: BC Managed Care – PPO | Attending: Emergency Medicine | Admitting: Emergency Medicine

## 2020-01-21 ENCOUNTER — Other Ambulatory Visit: Payer: Self-pay

## 2020-01-21 DIAGNOSIS — Z951 Presence of aortocoronary bypass graft: Secondary | ICD-10-CM | POA: Insufficient documentation

## 2020-01-21 DIAGNOSIS — Z7982 Long term (current) use of aspirin: Secondary | ICD-10-CM | POA: Diagnosis not present

## 2020-01-21 DIAGNOSIS — R059 Cough, unspecified: Secondary | ICD-10-CM | POA: Insufficient documentation

## 2020-01-21 DIAGNOSIS — R062 Wheezing: Secondary | ICD-10-CM | POA: Diagnosis not present

## 2020-01-21 DIAGNOSIS — M545 Low back pain, unspecified: Secondary | ICD-10-CM | POA: Insufficient documentation

## 2020-01-21 DIAGNOSIS — Z955 Presence of coronary angioplasty implant and graft: Secondary | ICD-10-CM | POA: Insufficient documentation

## 2020-01-21 DIAGNOSIS — Z20822 Contact with and (suspected) exposure to covid-19: Secondary | ICD-10-CM | POA: Diagnosis not present

## 2020-01-21 DIAGNOSIS — Z79899 Other long term (current) drug therapy: Secondary | ICD-10-CM | POA: Diagnosis not present

## 2020-01-21 LAB — RESP PANEL BY RT-PCR (FLU A&B, COVID) ARPGX2
Influenza A by PCR: NEGATIVE
Influenza B by PCR: NEGATIVE
SARS Coronavirus 2 by RT PCR: NEGATIVE

## 2020-01-21 MED ORDER — PROMETHAZINE-DM 6.25-15 MG/5ML PO SYRP: 5.0000 mL | ORAL_SOLUTION | Freq: Four times a day (QID) | ORAL | 0 refills | Status: AC | PRN

## 2020-01-21 MED ORDER — ALBUTEROL SULFATE HFA 108 (90 BASE) MCG/ACT IN AERS: 2.0000 | INHALATION_SPRAY | RESPIRATORY_TRACT | 0 refills | Status: AC | PRN

## 2020-01-21 MED ORDER — BENZONATATE 100 MG PO CAPS: 200.0000 mg | ORAL_CAPSULE | Freq: Three times a day (TID) | ORAL | 0 refills | Status: AC

## 2020-01-21 MED ORDER — AEROCHAMBER MV MISC: 2 refills | Status: AC

## 2020-01-21 NOTE — ED Provider Notes (Signed)
[facility]    CSN: [id number] Arrival date & time: 01/21/20  1123      History   Chief Complaint Chief Complaint  Patient presents with   Cough    HPI Philip Mora is a 64 y.o. male.   HPI   41 old male here for evaluation of productive cough and chest congestion for the past week.    Patient states that he is also had some associated wheezing, fatigue, body aches, and fever.  He had been producing a clear sputum but in the last day it has changed to a green sputum.  Patient is also having green nasal discharge.  Patient is also complaining of left lower back pain and loose bowels.  Patient denies diarrhea, nausea, vomiting.  Patient denies shortness of breath, ear pain or pressure, sore throat, changes to taste or smell, or sick contacts.  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  Patient has had his Covid vaccine but not his booster shot yet.  Patient has not had his flu shot.  Past Medical History:  Diagnosis Date   Anxiety    Hypercholesteremia    Hypertension     Patient Active Problem List   Diagnosis Date Noted   Anxiety 02/27/2015   Clinical depression 02/27/2015   Abnormal LFTs 02/27/2015   Heart disease 02/27/2015   Blood glucose elevated 02/27/2015   Adiposity 02/27/2015   Percutaneous transluminal coronary angioplasty status 02/27/2015   Acute post-operative pain 06/12/2013   Acute blood loss anemia 06/12/2013   H/O coronary artery bypass surgery 06/12/2013   Arteriosclerosis of coronary artery 06/06/2013   Presence of stent in coronary artery 06/06/2013   Atherosclerosis of coronary artery 03/17/2006   BP (high blood pressure) 03/17/2006   HLD (hyperlipidemia) 03/17/2006    Past Surgical History:  Procedure Laterality Date   CAROTID STENT INSERTION     CORONARY ARTERY BYPASS GRAFT         Home Medications    Prior to Admission medications    Medication Sig Start Date End Date Taking? Authorizing Provider  albuterol (VENTOLIN HFA) 108 (90 Base) MCG/ACT inhaler Inhale 2 puffs into the lungs every 4 (four) hours as needed for wheezing or shortness of breath. 01/21/20  Yes Becky Augusta, NP  aspirin 81 MG chewable tablet Chew by mouth daily.   Yes [provider]  atorvastatin (LIPITOR) 40 MG tablet Take 40 mg by mouth at bedtime. 02/18/15  Yes [provider]  benzonatate (TESSALON) 100 MG capsule Take 2 capsules (200 mg total) by mouth every 8 (eight) hours. 01/21/20  Yes Becky Augusta, NP  losartan (COZAAR) 25 MG tablet Take 25 mg by mouth daily. 12/16/19  Yes [provider]  metoprolol tartrate (LOPRESSOR) 50 MG tablet Take 50 mg by mouth.   Yes [provider]  promethazine-dextromethorphan (PROMETHAZINE-DM) 6.25-15 MG/5ML syrup Take 5 mLs by mouth 4 (four) times daily as needed. 01/21/20  Yes Becky Augusta, NP  sertraline (ZOLOFT) 100 MG tablet Take 2 tablets (200 mg total) by mouth daily. 11/30/17  Yes Maple Hudson., MD  Spacer/Aero-Holding Chambers (AEROCHAMBER MV) inhaler Use as instructed 01/21/20  Yes Becky Augusta, NP  busPIRone (BUSPAR) 15 MG tablet TAKE 1 TABLET (15 MG TOTAL) BY MOUTH 2 (TWO) TIMES DAILY. 02/22/19   Maple Hudson., MD  sertraline (ZOLOFT) 100 MG tablet TAKE 1 & 1/2 (ONE & ONE-HALF) TABLETS BY MOUTH ONCE DAILY  03/04/19   Maple Hudson., MD    Family History Family History  Problem Relation Age of Onset   Diabetes Mother    Hypertension Mother    Congestive Heart Failure Father    Heart disease Brother        CABG x 2    Social History Social History   Tobacco Use   Smoking status: Never Smoker   Smokeless tobacco: Never Used  Substance Use Topics   Alcohol use: No   Drug use: No     Allergies   Patient has no known allergies.   Review of Systems Review of Systems  Constitutional: Positive for fatigue and fever. Negative for  activity change and appetite change.  HENT: Positive for congestion and rhinorrhea. Negative for ear pain, sinus pressure, sinus pain and sore throat.   Respiratory: Positive for cough and wheezing. Negative for shortness of breath.   Cardiovascular: Negative for chest pain.  Gastrointestinal: Negative for diarrhea, nausea and vomiting.  Musculoskeletal: Positive for arthralgias, back pain and myalgias.  Skin: Negative for rash.  Neurological: Negative for headaches.  Hematological: Negative.   Psychiatric/Behavioral: Negative.      Physical Exam Triage Vital Signs ED Triage Vitals  Enc Vitals Group     BP      Pulse      Resp      Temp      Temp src      SpO2      Weight      Height      Head Circumference      Peak Flow      Pain Score      Pain Loc      Pain Edu?      Excl. in GC?    No data found.  Updated Vital Signs BP (!) 176/86 (BP Location: Right Arm)    Pulse 65    Temp 98.6 F (37 C) (Oral)    Resp 18    SpO2 98%   Visual Acuity Right Eye Distance:   Left Eye Distance:   Bilateral Distance:    Right Eye Near:   Left Eye Near:    Bilateral Near:     Physical Exam Vitals and nursing note reviewed.  Constitutional:      General: He is not in acute distress.    Appearance: Normal appearance. He is not toxic-appearing.  HENT:     Head: Normocephalic and atraumatic.     Right Ear: Tympanic membrane, ear canal and external ear normal.     Left Ear: Tympanic membrane, ear canal and external ear normal.     Nose: No congestion or rhinorrhea.     Mouth/Throat:     Mouth: Mucous membranes are moist.     Pharynx: Oropharynx is clear. No oropharyngeal exudate or posterior oropharyngeal erythema.  Cardiovascular:     Rate and Rhythm: Normal rate and regular rhythm.     Pulses: Normal pulses.     Heart sounds: Normal heart sounds. No murmur heard. No gallop.   Pulmonary:     Effort: Pulmonary effort is normal.     Breath sounds: Normal breath sounds. No  wheezing, rhonchi or rales.  Musculoskeletal:     Cervical back: Normal range of motion and neck supple.  Lymphadenopathy:     Cervical: No cervical adenopathy.  Skin:    General: Skin is warm and dry.     Capillary Refill: Capillary refill takes less than 2 seconds.  Findings: No rash.  Neurological:     General: No focal deficit present.     Mental Status: He is alert and oriented to person, place, and time.  Psychiatric:        Mood and Affect: Mood normal.        Behavior: Behavior normal.        Thought Content: Thought content normal.        Judgment: Judgment normal.      UC Treatments / Results  Labs (all labs ordered are listed, but only abnormal results are displayed) Labs Reviewed  RESP PANEL BY RT-PCR (FLU A&B, COVID) [id number]    EKG   Radiology DG Chest 2 View  Result Date: 01/21/2020 CLINICAL DATA:  Productive cough for 1 week. EXAM: CHEST - 2 VIEW COMPARISON:  Single-view of the chest 10/04/2017. FINDINGS: Lungs are clear. The patient is status post CABG. Heart size normal. No pneumothorax or pleural fluid. No acute or focal bony abnormality IMPRESSION: No acute disease. Electronically Signed   By: Drusilla Kannerhomas  Dalessio M.D.   On: 01/21/2020 15:08    Procedures Procedures (including critical care time)  Medications Ordered in UC Medications - No data to display  Initial Impression / Assessment and Plan / UC Course  I have reviewed the triage vital signs and the nursing notes.  Pertinent labs & imaging results that were available during my care of the patient were reviewed by me and considered in my medical decision making (see chart for details).   Patient is here for evaluation of cough and chest congestion that he has had for the past week.  Patient states that he had been producing clear sputum but in the last day he had a return to a green color.  Patient has some associated wheezing as well.  Physical exam reveals a nontoxic-appearing patient who is  not tachypneic or short of breath.  Lung sounds have wheezes in all fields.  Upper respiratory exam is benign.  Patient does have tenderness in his left lower back.  There is some mild degree of spasm in the musculature.  Possibly from all the coughing he has done.  Patient has no actual pain in his left hip joint.  Will send respiratory triplex panel and also obtain chest x-ray due to the patient having had fever and has had a change in his sputum.  Respiratory panel is negative for Covid or flu.  Chest x-ray is negative for pneumonia per radiology.  Will discharge patient home with diagnosis of cough and wheezing which I will treat with albuterol inhaler and spacer, Tessalon Perles, and Promethazine DM cough syrup.   Final Clinical Impressions(s) / UC Diagnoses   Final diagnoses:  Cough  Wheezing     Discharge Instructions     Your testing today did not reveal the presence of Covid, the flu, or pneumonia.  Use the albuterol inhaler with the spacer, 2 puffs every 4-6 hours, as needed for shortness of breath and wheezing.  Use the Tessalon Perles during the day as needed for cough. Take them with a small sip of water. They may give you some numbness to the base of your tongue or a metallic taste in her mouth, this is normal.  Use the Promethazine DM cough syrup for cough, congestion, and sleep at nighttime. This medication will make you drowsy.  You can take over-the-counter plain Mucinex to help loosen up the mucus so your body can better clear it.   Patient oral fluid intake  so that she will thin out your mucus in the makes it easier for you to expectorate.  If any new or worsening symptoms develop return for reevaluation or follow-up with your primary care provider.    ED Prescriptions    Medication Sig Dispense Auth. Provider   albuterol (VENTOLIN HFA) 108 (90 Base) MCG/ACT inhaler Inhale 2 puffs into the lungs every 4 (four) hours as needed for wheezing or shortness of  breath. 18 g Becky Augusta, NP   Spacer/Aero-Holding Chambers (AEROCHAMBER MV) inhaler Use as instructed 1 each Becky Augusta, NP   benzonatate (TESSALON) 100 MG capsule Take 2 capsules (200 mg total) by mouth every 8 (eight) hours. 21 capsule Becky Augusta, NP   promethazine-dextromethorphan (PROMETHAZINE-DM) 6.25-15 MG/5ML syrup Take 5 mLs by mouth 4 (four) times daily as needed. 118 mL Becky Augusta, NP     PDMP not reviewed this encounter.   Becky Augusta, NP 01/21/20 1530

## 2020-01-21 NOTE — Discharge Instructions (Addendum)
Your testing today did not reveal the presence of Covid, the flu, or pneumonia.  Use the albuterol inhaler with the spacer, 2 puffs every 4-6 hours, as needed for shortness of breath and wheezing.  Use the Tessalon Perles during the day as needed for cough. Take them with a small sip of water. They may give you some numbness to the base of your tongue or a metallic taste in her mouth, this is normal.  Use the Promethazine DM cough syrup for cough, congestion, and sleep at nighttime. This medication will make you drowsy.  You can take over-the-counter plain Mucinex to help loosen up the mucus so your body can better clear it.   Patient oral fluid intake so that she will thin out your mucus in the makes it easier for you to expectorate.  If any new or worsening symptoms develop return for reevaluation or follow-up with your primary care provider.

## 2020-01-21 NOTE — ED Triage Notes (Addendum)
Pt c/o productive cough with green sputum, congestion for approx 1 week. Reports subjective fever last Friday. Also reports diarrhea, left hip pain.  Denies n/v, sore throat, ear pain, SOB. Able to speak full sentences w/o difficulty. Has been taking coricidin, mucinex-no OTC medication for symptoms today. Faint insp wheezes noted bilateral bases.

## 2020-02-04 ENCOUNTER — Other Ambulatory Visit: Payer: Self-pay

## 2020-02-04 ENCOUNTER — Ambulatory Visit
Admission: EM | Admit: 2020-02-04 | Discharge: 2020-02-04 | Disposition: A | Discharge status: Home / Self Care | Payer: BC Managed Care – PPO | Attending: Sports Medicine | Admitting: Sports Medicine

## 2020-02-04 DIAGNOSIS — Z20822 Contact with and (suspected) exposure to covid-19: Secondary | ICD-10-CM | POA: Diagnosis not present

## 2020-02-04 DIAGNOSIS — R059 Cough, unspecified: Secondary | ICD-10-CM | POA: Diagnosis present

## 2020-02-04 DIAGNOSIS — J069 Acute upper respiratory infection, unspecified: Secondary | ICD-10-CM

## 2020-02-04 DIAGNOSIS — J40 Bronchitis, not specified as acute or chronic: Secondary | ICD-10-CM | POA: Diagnosis not present

## 2020-02-04 LAB — SARS CORONAVIRUS 2 (TAT 6-24 HRS): SARS Coronavirus 2: NEGATIVE

## 2020-02-04 MED ORDER — AZITHROMYCIN 250 MG PO TABS: 250.0000 mg | ORAL_TABLET | Freq: Every day | ORAL | 0 refills | Status: AC

## 2020-02-04 NOTE — Discharge Instructions (Addendum)
I am going to go ahead and treat him for an atypical pneumonia and give him a Z-Pak. He will continue with the [facility] as needed and add in an over-the-counter cough syrup. He has the albuterol with the inhaler and have encouraged him to use that. Continue with the Mucinex 1200 mg twice a day and plenty of rest and plenty of fluids.  Tylenol or Motrin for fever or discomfort. We will give him a work note keeping him out until Thursday of this week. Working to go ahead and retest him for COVID.  He follows out of the window to test for flu.  I did advise him that he needs to quarantine for 5 days if he test positive.  He can only return to work on day 6 if he is asymptomatic.  If he is positive he will need an updated work note. Follow-up as needed.

## 2020-02-04 NOTE — ED Provider Notes (Signed)
[facility]    CSN: [id number] Arrival date & time: 02/04/20  0801      History   Chief Complaint Chief Complaint  Patient presents with  . Cough    Nasal congestion     HPI Philip Mora is a 65 y.o. male.   Pleasant 65 year old male who presents for evaluation of the above issues.  He was seen here on 01/21/2020 with similar symptoms.  Had a negative flu and COVID test.  His symptoms are cough, congestion, low-grade fever, headache, body aches.  He is also having a little shortness of breath.  He also is having a little dizziness.  He is drinking plenty of fluids and using Mucinex twice a day.  Denies any chest pain, abdominal pain or urinary symptoms.  He works in the [employer] over at [employer].  No red flag signs or symptoms.  Normally sees Dr. Sullivan LoneGilbert for his primary care and Dr. Lady GaryFath for his cardiac issues.     Past Medical History:  Diagnosis Date  . Anxiety   . Hypercholesteremia   . Hypertension     Patient Active Problem List   Diagnosis Date Noted  . Anxiety 02/27/2015  . Clinical depression 02/27/2015  . Abnormal LFTs 02/27/2015  . Heart disease 02/27/2015  . Blood glucose elevated 02/27/2015  . Adiposity 02/27/2015  . Percutaneous transluminal coronary angioplasty status 02/27/2015  . Acute post-operative pain 06/12/2013  . Acute blood loss anemia 06/12/2013  . H/O coronary artery bypass surgery 06/12/2013  . Arteriosclerosis of coronary artery 06/06/2013  . Presence of stent in coronary artery 06/06/2013  . Atherosclerosis of coronary artery 03/17/2006  . BP (high blood pressure) 03/17/2006  . HLD (hyperlipidemia) 03/17/2006    Past Surgical History:  Procedure Laterality Date  . CAROTID STENT INSERTION    . CORONARY ARTERY BYPASS GRAFT         Home Medications    Prior to Admission medications   Medication Sig Start Date End Date Taking? Authorizing Provider  azithromycin (ZITHROMAX) 250 MG tablet Take 1 tablet  (250 mg total) by mouth daily. Take first 2 tablets together, then 1 every day until finished. 02/04/20  Yes Delton SeeBarnes, Lariyah Shetterly, MD  albuterol (VENTOLIN HFA) 108 (90 Base) MCG/ACT inhaler Inhale 2 puffs into the lungs every 4 (four) hours as needed for wheezing or shortness of breath. 01/21/20   Becky Augustayan, Jeremy, NP  aspirin 81 MG chewable tablet Chew by mouth daily.    [provider]  atorvastatin (LIPITOR) 40 MG tablet Take 40 mg by mouth at bedtime. 02/18/15   [provider]  benzonatate (TESSALON) 100 MG capsule Take 2 capsules (200 mg total) by mouth every 8 (eight) hours. 01/21/20   Becky Augustayan, Jeremy, NP  busPIRone (BUSPAR) 15 MG tablet TAKE 1 TABLET (15 MG TOTAL) BY MOUTH 2 (TWO) TIMES DAILY. 02/22/19   Maple HudsonGilbert, Richard L Jr., MD  losartan (COZAAR) 25 MG tablet Take 25 mg by mouth daily. 12/16/19   [provider]  metoprolol tartrate (LOPRESSOR) 50 MG tablet Take 50 mg by mouth.    [provider]  promethazine-dextromethorphan (PROMETHAZINE-DM) 6.25-15 MG/5ML syrup Take 5 mLs by mouth 4 (four) times daily as needed. 01/21/20   Becky Augustayan, Jeremy, NP  sertraline (ZOLOFT) 100 MG tablet Take 2 tablets (200 mg total) by mouth daily. 11/30/17   Maple HudsonGilbert, Richard L Jr., MD  sertraline (ZOLOFT) 100 MG tablet TAKE 1 & 1/2 (ONE & ONE-HALF) TABLETS BY MOUTH ONCE DAILY 03/04/19   Sullivan LoneGilbert,  Leonette Monarch., MD  Spacer/Aero-Holding Chambers (AEROCHAMBER MV) inhaler Use as instructed 01/21/20   Becky Augusta, NP    Family History Family History  Problem Relation Age of Onset  . Diabetes Mother   . Hypertension Mother   . Congestive Heart Failure Father   . Heart disease Brother        CABG x 2    Social History Social History   Tobacco Use  . Smoking status: Never Smoker  . Smokeless tobacco: Never Used  Substance Use Topics  . Alcohol use: No  . Drug use: No     Allergies   Patient has no known allergies.   Review of Systems Review of Systems  Constitutional: Positive  for fever. Negative for activity change, appetite change, chills, diaphoresis and fatigue.  HENT: Positive for congestion. Negative for ear pain, rhinorrhea, sinus pressure, sinus pain and sore throat.   Respiratory: Positive for cough and shortness of breath. Negative for apnea, chest tightness, wheezing and stridor.   Cardiovascular: Negative for chest pain and palpitations.  Neurological: Positive for dizziness, light-headedness and headaches. Negative for tremors, seizures, syncope, speech difficulty and weakness.  All other systems reviewed and are negative.    Physical Exam Triage Vital Signs ED Triage Vitals  Enc Vitals Group     BP 02/04/20 0816 (S) (!) 192/91     Pulse Rate 02/04/20 0816 60     Resp 02/04/20 0816 16     Temp 02/04/20 0816 98.1 F (36.7 C)     Temp Source 02/04/20 0816 Oral     SpO2 02/04/20 0816 100 %     Weight 02/04/20 0814 188 lb (85.3 kg)     Height --      Head Circumference --      Peak Flow --      Pain Score 02/04/20 0814 0     Pain Loc --      Pain Edu? --      Excl. in GC? --    No data found.  Updated Vital Signs BP (S) (!) 192/91 (BP Location: Right Arm) Comment: Md aware  Pulse 60   Temp 98.1 F (36.7 C) (Oral)   Resp 16   Wt 85.3 kg   SpO2 100%   BMI 26.98 kg/m   Visual Acuity Right Eye Distance:   Left Eye Distance:   Bilateral Distance:    Right Eye Near:   Left Eye Near:    Bilateral Near:     Physical Exam Vitals and nursing note reviewed.  Constitutional:      General: He is not in acute distress.    Appearance: Normal appearance. He is not ill-appearing or toxic-appearing.  HENT:     Head: Normocephalic and atraumatic.     Right Ear: Tympanic membrane normal.     Left Ear: Tympanic membrane normal.     Nose: Congestion present. No rhinorrhea.     Mouth/Throat:     Mouth: Mucous membranes are moist.     Pharynx: Posterior oropharyngeal erythema present. No oropharyngeal exudate.  Eyes:     Extraocular  Movements: Extraocular movements intact.     Conjunctiva/sclera: Conjunctivae normal.     Pupils: Pupils are equal, round, and reactive to light.  Cardiovascular:     Rate and Rhythm: Normal rate and regular rhythm.     Pulses: Normal pulses.     Heart sounds: Normal heart sounds. No murmur heard. No friction rub. No gallop.   Pulmonary:  Effort: Pulmonary effort is normal. No respiratory distress.     Breath sounds: No stridor. No rhonchi or rales.     Comments: Mild wheeze bilateral lungs that clears with coughing. Musculoskeletal:     Cervical back: Normal range of motion and neck supple. No rigidity.  Lymphadenopathy:     Cervical: Cervical adenopathy present.  Neurological:     Mental Status: He is alert.      UC Treatments / Results  Labs (all labs ordered are listed, but only abnormal results are displayed) Labs Reviewed  SARS CORONAVIRUS 2 (TAT 6-24 HRS)    EKG   Radiology No results found.  Procedures Procedures (including critical care time)  Medications Ordered in UC Medications - No data to display  Initial Impression / Assessment and Plan / UC Course  I have reviewed the triage vital signs and the nursing notes.  Pertinent labs & imaging results that were available during my care of the patient were reviewed by me and considered in my medical decision making (see chart for details).  Clinical impression Pleasant 65 year old male with URI symptoms.  He has been tested for COVID and influenza back in December which were negative.  He has persistent cough.  Treatment plan: 1.  The findings and treatment plan were discussed in detail with the patient.  Patient was in agreement. 2.  I am going to go ahead and treat him for an atypical pneumonia and give him a Z-Pak. 3.  He will continue with the Tessalon Perles as needed and add in an over-the-counter cough syrup. 4.  He has the albuterol with the inhaler and have encouraged him to use that. 5.   Continue with the Mucinex 1200 mg twice a day and plenty of rest and plenty of fluids.  Tylenol or Motrin for fever or discomfort. 6.  We will give him a work note keeping him out until Thursday of this week. 7.  Working to go ahead and retest him for COVID.  He follows out of the window to test for flu.  I did advise him that he needs to quarantine for 5 days if he test positive.  He can only return to work on day 6 if he is asymptomatic.  If he is positive he will need an updated work note. 8.  Follow-up as needed.     Final Clinical Impressions(s) / UC Diagnoses   Final diagnoses:  Cough  Viral upper respiratory tract infection  Bronchitis     Discharge Instructions      I am going to go ahead and treat him for an atypical pneumonia and give him a Z-Pak. He will continue with the [facility] as needed and add in an over-the-counter cough syrup. He has the albuterol with the inhaler and have encouraged him to use that. Continue with the Mucinex 1200 mg twice a day and plenty of rest and plenty of fluids.  Tylenol or Motrin for fever or discomfort. We will give him a work note keeping him out until Thursday of this week. Working to go ahead and retest him for COVID.  He follows out of the window to test for flu.  I did advise him that he needs to quarantine for 5 days if he test positive.  He can only return to work on day 6 if he is asymptomatic.  If he is positive he will need an updated work note. Follow-up as needed.    ED Prescriptions    Medication Sig  Dispense Auth. Provider   azithromycin (ZITHROMAX) 250 MG tablet Take 1 tablet (250 mg total) by mouth daily. Take first 2 tablets together, then 1 every day until finished. 6 tablet Delton See, MD     PDMP not reviewed this encounter.   Delton See, MD 02/04/20 [phone]

## 2020-02-04 NOTE — ED Triage Notes (Signed)
Patient presents to Urgent Care with complaints of cough, congestion, pt seen 12/28. Pt states he is following up from last visit has developed new symptoms increased fatigue, chills yesterday. Had a positive covid exposure 01/05.

## 2022-12-07 IMAGING — CR DG CHEST 2V
3 series · 3 of 3 positions shown · non-contrast
Comparison: Single-view of the chest 10/04/2017.

CLINICAL DATA: Productive cough for 1 week.

EXAM:
CHEST - 2 VIEW

[chest pa (1 of 2)]
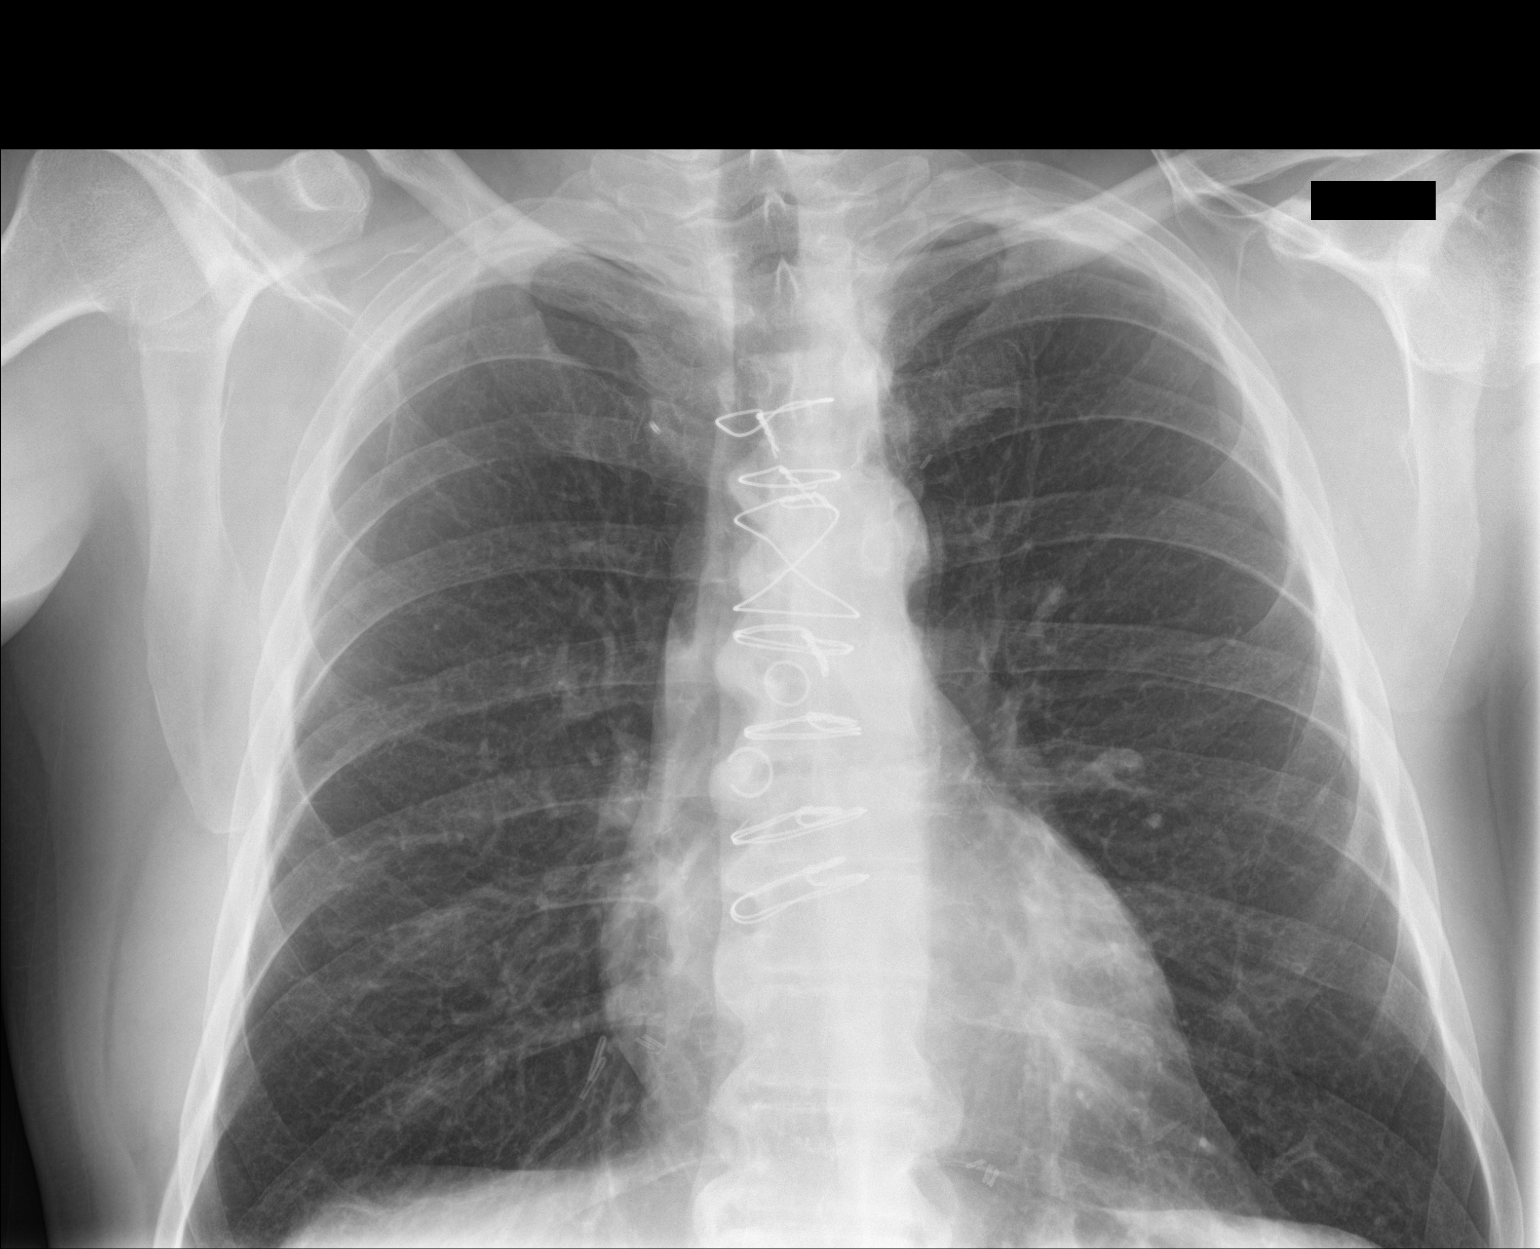

[chest lat]
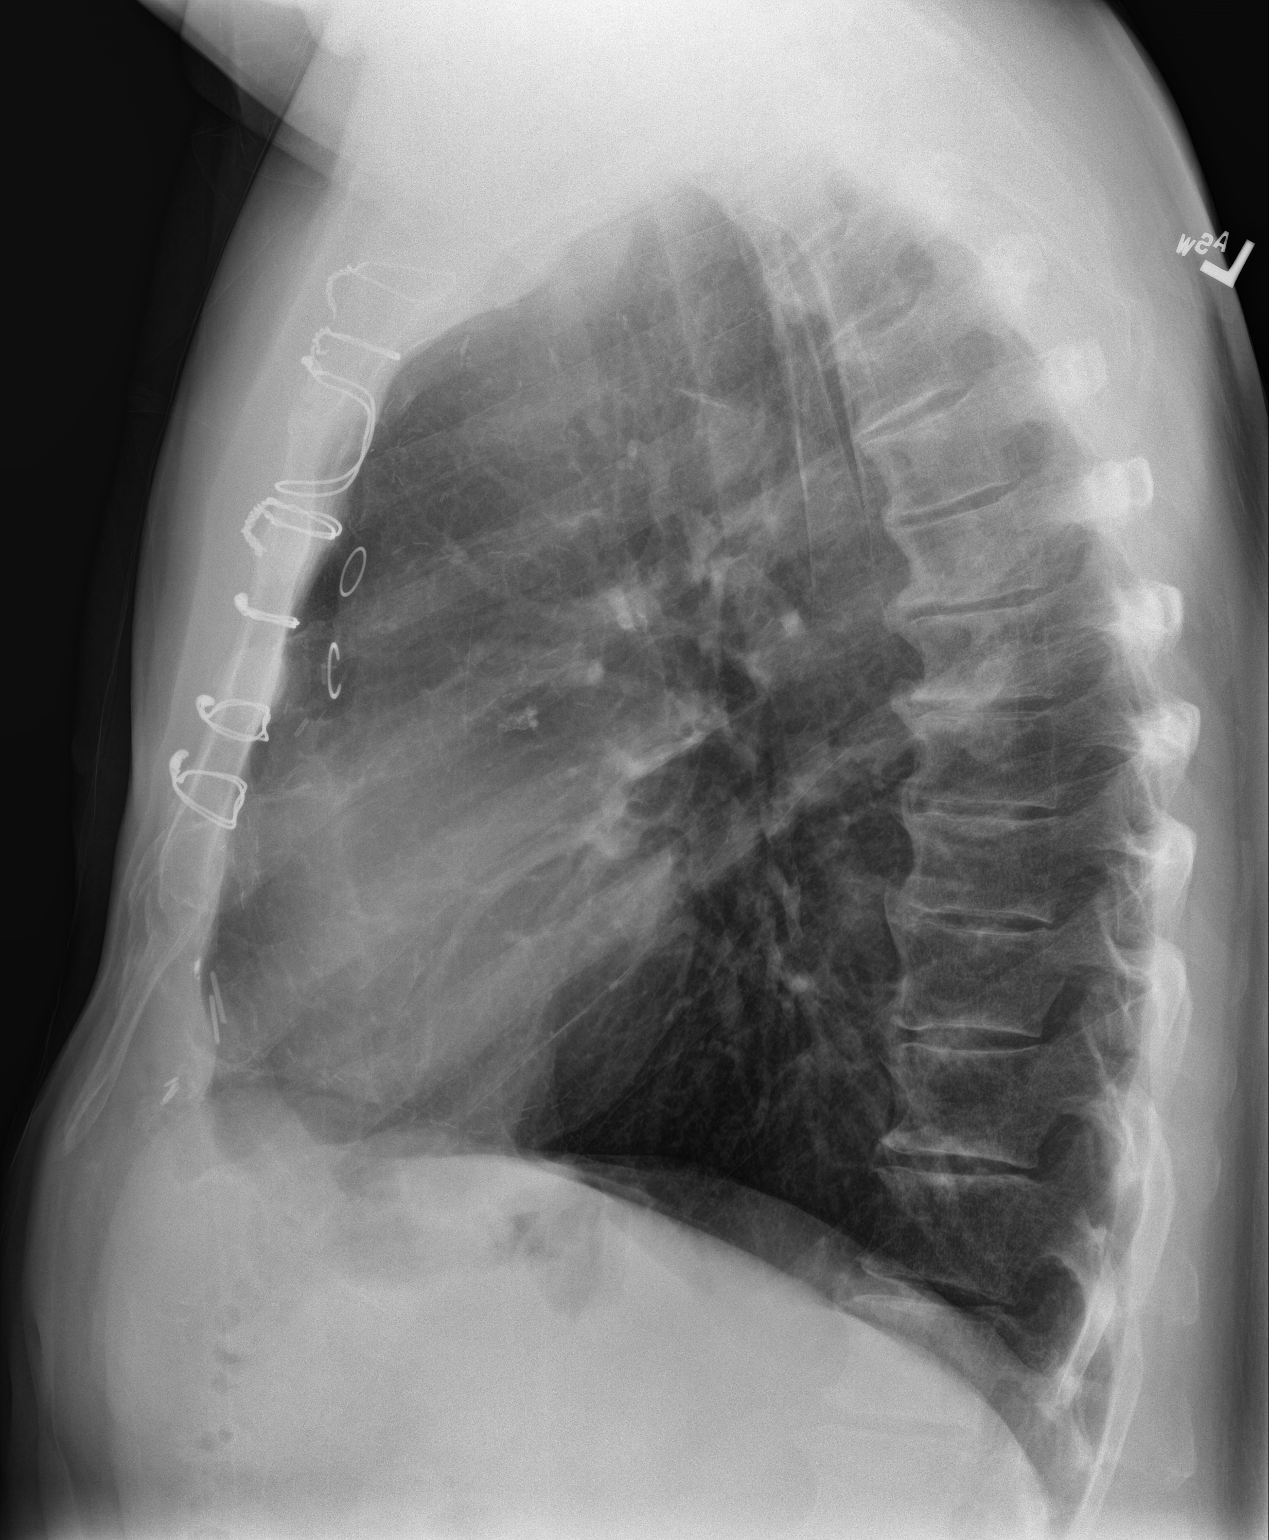

[chest pa (2 of 2)]
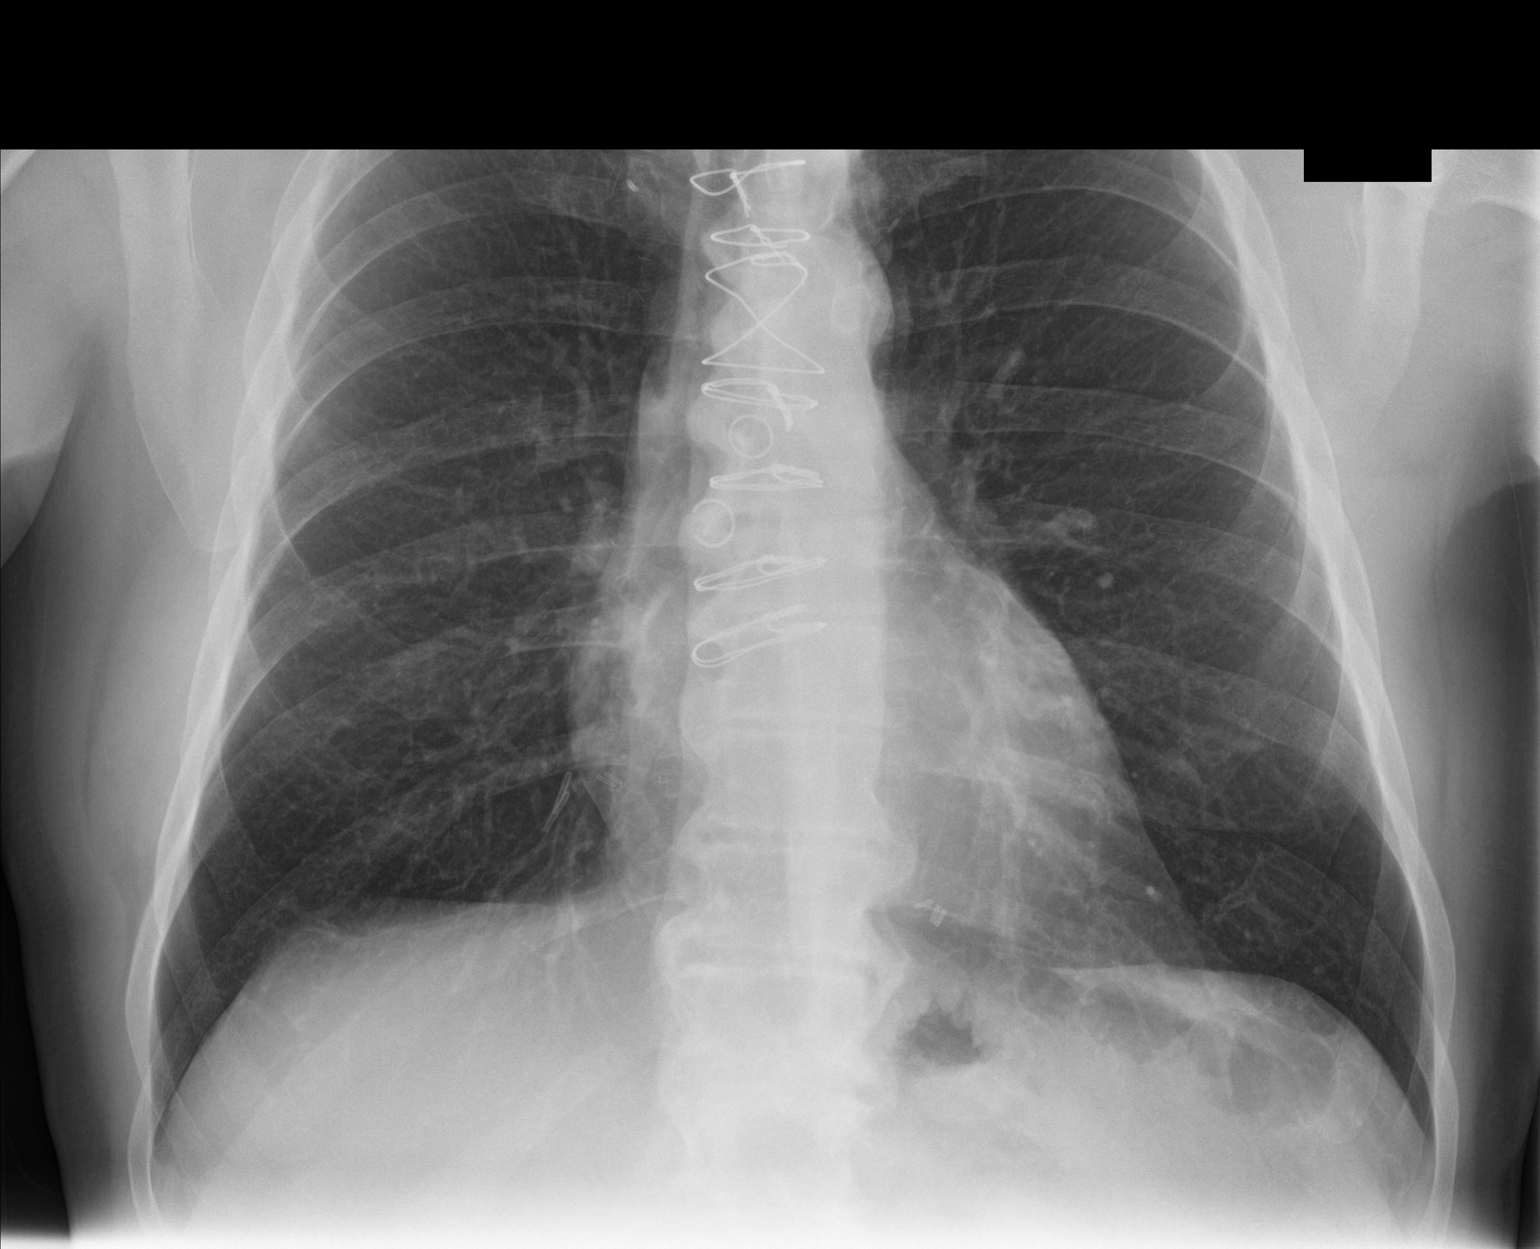

[3 of 3 positions shown; findings below may reference images not displayed]

FINDINGS: Lungs are clear. The patient is status post CABG. Heart size normal.
No pneumothorax or pleural fluid. No acute or focal bony abnormality
IMPRESSION: No acute disease.
